# Patient Record
Sex: Male | Born: 1960
Health system: Southern US, Community
[De-identification: ages and names within clinical notes are randomized; demographics above are authoritative.]

## PROBLEM LIST (undated history)

## (undated) DIAGNOSIS — J45909 Unspecified asthma, uncomplicated: Secondary | ICD-10-CM

## (undated) DIAGNOSIS — E785 Hyperlipidemia, unspecified: Secondary | ICD-10-CM

## (undated) HISTORY — PX: OTHER SURGICAL HISTORY: SHX169

## (undated) HISTORY — DX: Unspecified asthma, uncomplicated: J45.909

## (undated) HISTORY — DX: Hyperlipidemia, unspecified: E78.5

---
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
## 2005-06-19 ENCOUNTER — Ambulatory Visit: Payer: Self-pay | Admitting: Internal Medicine

## 2005-06-24 ENCOUNTER — Ambulatory Visit: Payer: Self-pay | Admitting: Internal Medicine

## 2007-06-25 ENCOUNTER — Ambulatory Visit: Payer: Self-pay | Admitting: Internal Medicine

## 2007-06-25 LAB — CONVERTED CEMR LAB
ALT: 21 units/L (ref 0–53)
AST: 17 units/L (ref 0–37)
Albumin: 4.2 g/dL (ref 3.5–5.2)
Alkaline Phosphatase: 47 units/L (ref 39–117)
BUN: 13 mg/dL (ref 6–23)
Basophils Absolute: 0 10*3/uL (ref 0.0–0.1)
Basophils Relative: 0.3 % (ref 0.0–1.0)
Bilirubin Urine: NEGATIVE
Bilirubin, Direct: 0.1 mg/dL (ref 0.0–0.3)
Blood in Urine, dipstick: NEGATIVE
CO2: 30 meq/L (ref 19–32)
Calcium: 10.2 mg/dL (ref 8.4–10.5)
Chloride: 114 meq/L — ABNORMAL HIGH (ref 96–112)
Cholesterol: 258 mg/dL (ref 0–200)
Creatinine, Ser: 0.9 mg/dL (ref 0.4–1.5)
Direct LDL: 188.4 mg/dL
Eosinophils Absolute: 0.1 10*3/uL (ref 0.0–0.6)
Eosinophils Relative: 2.6 % (ref 0.0–5.0)
GFR calc Af Amer: 117 mL/min
GFR calc non Af Amer: 97 mL/min
Glucose, Bld: 96 mg/dL (ref 70–99)
Glucose, Urine, Semiquant: NEGATIVE
HCT: 41.6 % (ref 39.0–52.0)
HDL: 39.4 mg/dL (ref >39.0)
Hemoglobin: 14.3 g/dL (ref 13.0–17.0)
Ketones, urine, test strip: NEGATIVE
Lymphocytes Relative: 25 % (ref 12.0–46.0)
MCHC: 34.5 g/dL (ref 30.0–36.0)
MCV: 83.4 fL (ref 78.0–100.0)
Monocytes Absolute: 0.3 10*3/uL (ref 0.2–0.7)
Monocytes Relative: 5 % (ref 3.0–11.0)
Neutro Abs: 3.5 10*3/uL (ref 1.4–7.7)
Neutrophils Relative %: 67.1 % (ref 43.0–77.0)
Nitrite: NEGATIVE
Platelets: 246 10*3/uL (ref 150–400)
Potassium: 5.1 meq/L (ref 3.5–5.1)
Protein, U semiquant: NEGATIVE
RBC: 4.99 M/uL (ref 4.22–5.81)
RDW: 12.1 % (ref 11.5–14.6)
Sodium: 154 meq/L — ABNORMAL HIGH (ref 135–145)
Specific Gravity, Urine: 1.025
TSH: 0.65 microintl units/mL (ref 0.35–5.50)
Total Bilirubin: 1 mg/dL (ref 0.3–1.2)
Total CHOL/HDL Ratio: 6.5
Total Protein: 6.6 g/dL (ref 6.0–8.3)
Triglycerides: 155 mg/dL — ABNORMAL HIGH (ref 0–149)
Urobilinogen, UA: 0.2
VLDL: 31 mg/dL (ref 0–40)
WBC Urine, dipstick: NEGATIVE
WBC: 5.2 10*3/uL (ref 4.5–10.5)
pH: 5

## 2007-06-30 DIAGNOSIS — K219 Gastro-esophageal reflux disease without esophagitis: Secondary | ICD-10-CM

## 2007-07-28 ENCOUNTER — Ambulatory Visit: Payer: Self-pay | Admitting: Internal Medicine

## 2007-07-28 DIAGNOSIS — E782 Mixed hyperlipidemia: Secondary | ICD-10-CM | POA: Insufficient documentation

## 2007-09-20 ENCOUNTER — Ambulatory Visit: Payer: Self-pay | Admitting: Internal Medicine

## 2007-09-20 DIAGNOSIS — T887XXA Unspecified adverse effect of drug or medicament, initial encounter: Secondary | ICD-10-CM

## 2007-09-20 LAB — CONVERTED CEMR LAB
ALT: 18 units/L (ref 0–53)
AST: 18 units/L (ref 0–37)
Albumin: 4.4 g/dL (ref 3.5–5.2)
Alkaline Phosphatase: 43 units/L (ref 39–117)
BUN: 13 mg/dL (ref 6–23)
Bilirubin, Direct: 0.2 mg/dL (ref 0.0–0.3)
CO2: 30 meq/L (ref 19–32)
Calcium: 10.2 mg/dL (ref 8.4–10.5)
Chloride: 102 meq/L (ref 96–112)
Cholesterol: 214 mg/dL (ref 0–200)
Creatinine, Ser: 1 mg/dL (ref 0.4–1.5)
Direct LDL: 138.7 mg/dL
GFR calc Af Amer: 103 mL/min
GFR calc non Af Amer: 86 mL/min
Glucose, Bld: 109 mg/dL — ABNORMAL HIGH (ref 70–99)
HDL: 44.4 mg/dL (ref >39.0)
Potassium: 4.9 meq/L (ref 3.5–5.1)
Sodium: 140 meq/L (ref 135–145)
Total Bilirubin: 1.1 mg/dL (ref 0.3–1.2)
Total CHOL/HDL Ratio: 4.8
Total Protein: 7.1 g/dL (ref 6.0–8.3)
Triglycerides: 145 mg/dL (ref 0–149)
VLDL: 29 mg/dL (ref 0–40)

## 2007-09-28 ENCOUNTER — Ambulatory Visit: Payer: Self-pay | Admitting: Internal Medicine

## 2009-01-16 ENCOUNTER — Telehealth: Payer: Self-pay | Admitting: Internal Medicine

## 2009-02-15 ENCOUNTER — Ambulatory Visit: Payer: Self-pay | Admitting: Internal Medicine

## 2009-02-15 LAB — CONVERTED CEMR LAB
BUN: 11 mg/dL (ref 6–23)
Bilirubin, Direct: 0.2 mg/dL (ref 0.0–0.3)
CO2: 27 meq/L (ref 19–32)
Chloride: 104 meq/L (ref 96–112)
Cholesterol: 181 mg/dL (ref 0–200)
Creatinine, Ser: 1 mg/dL (ref 0.4–1.5)
Eosinophils Absolute: 0.1 10*3/uL (ref 0.0–0.7)
Glucose, Bld: 102 mg/dL — ABNORMAL HIGH (ref 70–99)
Glucose, Urine, Semiquant: NEGATIVE
HCT: 40.2 % (ref 39.0–52.0)
Lymphs Abs: 1.2 10*3/uL (ref 0.7–4.0)
MCHC: 34.6 g/dL (ref 30.0–36.0)
MCV: 83.1 fL (ref 78.0–100.0)
Monocytes Absolute: 0.3 10*3/uL (ref 0.1–1.0)
Neutrophils Relative %: 69.1 % (ref 43.0–77.0)
PSA: 0.9 ng/mL (ref 0.10–4.00)
Platelets: 210 10*3/uL (ref 150.0–400.0)
Specific Gravity, Urine: 1.025
TSH: 0.75 microintl units/mL (ref 0.35–5.50)
Total Bilirubin: 1.1 mg/dL (ref 0.3–1.2)
Total Protein: 7.3 g/dL (ref 6.0–8.3)
Triglycerides: 93 mg/dL (ref 0.0–149.0)
Urobilinogen, UA: 0.2
pH: 6

## 2009-02-22 ENCOUNTER — Ambulatory Visit: Payer: Self-pay | Admitting: Internal Medicine

## 2010-10-04 ENCOUNTER — Ambulatory Visit
Admission: RE | Admit: 2010-10-04 | Discharge: 2010-10-04 | Payer: Self-pay | Source: Home / Self Care | Attending: Internal Medicine | Admitting: Internal Medicine

## 2010-10-04 LAB — CONVERTED CEMR LAB
AST: 18 units/L (ref 0–37)
Albumin: 4 g/dL (ref 3.5–5.2)
BUN: 16 mg/dL (ref 6–23)
Basophils Absolute: 0 10*3/uL (ref 0.0–0.1)
Blood in Urine, dipstick: NEGATIVE
CO2: 27 meq/L (ref 19–32)
Chloride: 105 meq/L (ref 96–112)
Direct LDL: 157.4 mg/dL
Eosinophils Absolute: 0.2 10*3/uL (ref 0.0–0.7)
GFR calc non Af Amer: 92.56 mL/min (ref >60.00)
Glucose, Bld: 100 mg/dL — ABNORMAL HIGH (ref 70–99)
Glucose, Urine, Semiquant: NEGATIVE
HCT: 41.7 % (ref 39.0–52.0)
HDL: 39.9 mg/dL (ref >39.00)
Hemoglobin: 14.1 g/dL (ref 13.0–17.0)
Lymphs Abs: 1.5 10*3/uL (ref 0.7–4.0)
MCHC: 33.9 g/dL (ref 30.0–36.0)
MCV: 83.2 fL (ref 78.0–100.0)
Monocytes Absolute: 0.3 10*3/uL (ref 0.1–1.0)
Monocytes Relative: 5.2 % (ref 3.0–12.0)
Neutro Abs: 3.5 10*3/uL (ref 1.4–7.7)
Nitrite: NEGATIVE
Platelets: 236 10*3/uL (ref 150.0–400.0)
Potassium: 4.5 meq/L (ref 3.5–5.1)
RDW: 13.2 % (ref 11.5–14.6)
Sodium: 137 meq/L (ref 135–145)
Specific Gravity, Urine: 1.025
TSH: 0.72 microintl units/mL (ref 0.35–5.50)
Total Bilirubin: 0.8 mg/dL (ref 0.3–1.2)
WBC Urine, dipstick: NEGATIVE
pH: 5

## 2010-10-11 ENCOUNTER — Ambulatory Visit: Admit: 2010-10-11 | Payer: Self-pay | Admitting: Internal Medicine

## 2010-10-18 ENCOUNTER — Ambulatory Visit
Admission: RE | Admit: 2010-10-18 | Discharge: 2010-10-18 | Payer: Self-pay | Source: Home / Self Care | Attending: Internal Medicine | Admitting: Internal Medicine
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
## 2011-01-24 ENCOUNTER — Encounter: Payer: Self-pay | Admitting: Internal Medicine

## 2011-06-11 ENCOUNTER — Other Ambulatory Visit: Payer: Self-pay | Admitting: *Deleted

## 2011-06-11 MED ORDER — SIMVASTATIN 20 MG PO TABS: 20.0000 mg | ORAL_TABLET | Freq: Every evening | ORAL | Status: DC

## 2012-10-18 ENCOUNTER — Encounter: Payer: Self-pay | Admitting: Internal Medicine

## 2012-10-30 ENCOUNTER — Other Ambulatory Visit: Payer: Self-pay | Admitting: Internal Medicine

## 2012-11-06 ENCOUNTER — Other Ambulatory Visit: Payer: Self-pay | Admitting: Internal Medicine

## 2012-11-13 ENCOUNTER — Other Ambulatory Visit: Payer: Self-pay | Admitting: Internal Medicine

## 2012-11-19 ENCOUNTER — Encounter: Payer: Self-pay | Admitting: Internal Medicine

## 2012-12-02 ENCOUNTER — Ambulatory Visit (INDEPENDENT_AMBULATORY_CARE_PROVIDER_SITE_OTHER): Payer: Managed Care, Other (non HMO) | Admitting: Internal Medicine

## 2012-12-02 ENCOUNTER — Encounter: Payer: Self-pay | Admitting: Internal Medicine

## 2012-12-02 VITALS — BP 114/84 | Temp 97.4°F | Ht 71.5 in | Wt 232.0 lb

## 2012-12-02 DIAGNOSIS — Z0000 Encounter for general adult medical examination without abnormal findings: Secondary | ICD-10-CM

## 2012-12-02 DIAGNOSIS — E663 Overweight: Secondary | ICD-10-CM

## 2012-12-02 DIAGNOSIS — Z23 Encounter for immunization: Secondary | ICD-10-CM

## 2012-12-02 LAB — CBC WITH DIFFERENTIAL/PLATELET
Eosinophils Relative: 3.1 % (ref 0.0–5.0)
HCT: 41.5 % (ref 39.0–52.0)
Hemoglobin: 13.9 g/dL (ref 13.0–17.0)
Lymphs Abs: 1.2 10*3/uL (ref 0.7–4.0)
Monocytes Relative: 5.1 % (ref 3.0–12.0)
Neutro Abs: 4.2 10*3/uL (ref 1.4–7.7)
RBC: 5.07 Mil/uL (ref 4.22–5.81)
WBC: 5.8 10*3/uL (ref 4.5–10.5)

## 2012-12-02 LAB — HEPATIC FUNCTION PANEL
Albumin: 4 g/dL (ref 3.5–5.2)
Bilirubin, Direct: 0.1 mg/dL (ref 0.0–0.3)
Total Protein: 6.7 g/dL (ref 6.0–8.3)

## 2012-12-02 LAB — POCT URINALYSIS DIPSTICK
Leukocytes, UA: NEGATIVE
Spec Grav, UA: 1.02
Urobilinogen, UA: 0.2
pH, UA: 6

## 2012-12-02 LAB — LDL CHOLESTEROL, DIRECT: Direct LDL: 145.8 mg/dL

## 2012-12-02 LAB — BASIC METABOLIC PANEL
CO2: 27 mEq/L (ref 19–32)
Calcium: 9.2 mg/dL (ref 8.4–10.5)
Creatinine, Ser: 0.9 mg/dL (ref 0.4–1.5)
GFR: 91.77 mL/min (ref >60.00)

## 2012-12-02 LAB — LIPID PANEL
HDL: 43.8 mg/dL (ref >39.00)
Total CHOL/HDL Ratio: 5
VLDL: 44.6 mg/dL — ABNORMAL HIGH (ref 0.0–40.0)

## 2012-12-02 MED ORDER — SIMVASTATIN 20 MG PO TABS: 20.0000 mg | ORAL_TABLET | Freq: Every evening | ORAL | Status: DC
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
## 2012-12-06 ENCOUNTER — Other Ambulatory Visit: Payer: Self-pay | Admitting: *Deleted

## 2012-12-06 MED ORDER — CIPROFLOXACIN HCL 500 MG PO TABS: 500.0000 mg | ORAL_TABLET | Freq: Two times a day (BID) | ORAL | Status: DC

## 2012-12-22 ENCOUNTER — Other Ambulatory Visit: Payer: Self-pay

## 2012-12-29 ENCOUNTER — Encounter: Payer: Self-pay | Admitting: Internal Medicine

## 2013-02-04 ENCOUNTER — Telehealth: Payer: Self-pay | Admitting: Internal Medicine

## 2013-02-04 NOTE — Telephone Encounter (Signed)
Pt would like a return call asap concerning his PSA results.  Pt has finished his antibiotics and was instructed to follow up.

## 2013-02-04 NOTE — Telephone Encounter (Signed)
Pt needs a lab appt for PSA

## 2013-02-04 NOTE — Telephone Encounter (Signed)
appt made/kh 

## 2013-02-04 NOTE — Telephone Encounter (Signed)
lmom for pt to call and sch labs

## 2013-02-09 ENCOUNTER — Other Ambulatory Visit (INDEPENDENT_AMBULATORY_CARE_PROVIDER_SITE_OTHER): Payer: Managed Care, Other (non HMO)

## 2013-02-09 DIAGNOSIS — R972 Elevated prostate specific antigen [PSA]: Secondary | ICD-10-CM

## 2013-02-11 ENCOUNTER — Encounter: Payer: Self-pay | Admitting: Internal Medicine

## 2013-08-11 ENCOUNTER — Other Ambulatory Visit: Payer: Self-pay

## 2013-12-02 ENCOUNTER — Other Ambulatory Visit: Payer: Self-pay

## 2013-12-05 ENCOUNTER — Other Ambulatory Visit (INDEPENDENT_AMBULATORY_CARE_PROVIDER_SITE_OTHER): Payer: Managed Care, Other (non HMO)

## 2013-12-05 DIAGNOSIS — Z0000 Encounter for general adult medical examination without abnormal findings: Secondary | ICD-10-CM

## 2013-12-05 LAB — CBC WITH DIFFERENTIAL/PLATELET
Basophils Absolute: 0 10*3/uL (ref 0.0–0.1)
Basophils Relative: 0.4 % (ref 0.0–3.0)
EOS PCT: 1.6 % (ref 0.0–5.0)
Eosinophils Absolute: 0.1 10*3/uL (ref 0.0–0.7)
HEMATOCRIT: 44.3 % (ref 39.0–52.0)
Hemoglobin: 14.9 g/dL (ref 13.0–17.0)
Lymphocytes Relative: 24 % (ref 12.0–46.0)
Lymphs Abs: 0.8 10*3/uL (ref 0.7–4.0)
MCHC: 33.6 g/dL (ref 30.0–36.0)
MCV: 82.9 fl (ref 78.0–100.0)
MONOS PCT: 13.5 % — AB (ref 3.0–12.0)
Monocytes Absolute: 0.5 10*3/uL (ref 0.1–1.0)
NEUTROS PCT: 60.5 % (ref 43.0–77.0)
Neutro Abs: 2.1 10*3/uL (ref 1.4–7.7)
PLATELETS: 245 10*3/uL (ref 150.0–400.0)
RBC: 5.34 Mil/uL (ref 4.22–5.81)
RDW: 13.2 % (ref 11.5–14.6)
WBC: 3.4 10*3/uL — AB (ref 4.5–10.5)

## 2013-12-05 LAB — POCT URINALYSIS DIPSTICK
Bilirubin, UA: NEGATIVE
Blood, UA: NEGATIVE
Glucose, UA: NEGATIVE
Ketones, UA: NEGATIVE
LEUKOCYTES UA: NEGATIVE
Nitrite, UA: NEGATIVE
SPEC GRAV UA: 1.025
Urobilinogen, UA: 0.2
pH, UA: 5.5

## 2013-12-05 LAB — LIPID PANEL
CHOLESTEROL: 201 mg/dL — AB (ref 0–200)
HDL: 46.7 mg/dL (ref >39.00)
LDL Cholesterol: 122 mg/dL — ABNORMAL HIGH (ref 0–99)
Total CHOL/HDL Ratio: 4
Triglycerides: 161 mg/dL — ABNORMAL HIGH (ref 0.0–149.0)
VLDL: 32.2 mg/dL (ref 0.0–40.0)

## 2013-12-05 LAB — BASIC METABOLIC PANEL
BUN: 12 mg/dL (ref 6–23)
CO2: 26 mEq/L (ref 19–32)
CREATININE: 1.1 mg/dL (ref 0.4–1.5)
Calcium: 9.3 mg/dL (ref 8.4–10.5)
Chloride: 104 mEq/L (ref 96–112)
GFR: 77.63 mL/min (ref >60.00)
Glucose, Bld: 92 mg/dL (ref 70–99)
POTASSIUM: 4.6 meq/L (ref 3.5–5.1)
Sodium: 138 mEq/L (ref 135–145)

## 2013-12-05 LAB — HEPATIC FUNCTION PANEL
ALT: 19 U/L (ref 0–53)
AST: 17 U/L (ref 0–37)
Albumin: 4.3 g/dL (ref 3.5–5.2)
Alkaline Phosphatase: 56 U/L (ref 39–117)
BILIRUBIN DIRECT: 0.1 mg/dL (ref 0.0–0.3)
BILIRUBIN TOTAL: 0.7 mg/dL (ref 0.3–1.2)
Total Protein: 7.2 g/dL (ref 6.0–8.3)

## 2013-12-05 LAB — PSA: PSA: 1.75 ng/mL (ref 0.10–4.00)

## 2013-12-05 LAB — TSH: TSH: 0.78 u[IU]/mL (ref 0.35–5.50)

## 2013-12-09 ENCOUNTER — Ambulatory Visit (INDEPENDENT_AMBULATORY_CARE_PROVIDER_SITE_OTHER): Payer: Managed Care, Other (non HMO) | Admitting: Internal Medicine

## 2013-12-09 ENCOUNTER — Encounter: Payer: Self-pay | Admitting: Internal Medicine

## 2013-12-09 VITALS — BP 114/76 | HR 88 | Temp 98.0°F | Ht 71.25 in | Wt 230.0 lb

## 2013-12-09 DIAGNOSIS — Z0000 Encounter for general adult medical examination without abnormal findings: Secondary | ICD-10-CM

## 2013-12-09 NOTE — Progress Notes (Signed)
Pre visit review using our clinic review tool, if applicable. No additional management support is needed unless otherwise documented below in the visit note. 
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
## 2014-01-13 ENCOUNTER — Encounter: Payer: Self-pay | Admitting: Internal Medicine

## 2015-01-15 DIAGNOSIS — Z8249 Family history of ischemic heart disease and other diseases of the circulatory system: Secondary | ICD-10-CM | POA: Insufficient documentation

## 2015-01-15 DIAGNOSIS — J45990 Exercise induced bronchospasm: Secondary | ICD-10-CM | POA: Insufficient documentation

## 2016-08-05 LAB — HM HEPATITIS C SCREENING LAB: HM Hepatitis Screen: NEGATIVE

## 2017-12-09 ENCOUNTER — Encounter: Payer: Self-pay | Admitting: Emergency Medicine

## 2017-12-16 ENCOUNTER — Encounter: Payer: Self-pay | Admitting: Emergency Medicine

## 2017-12-25 ENCOUNTER — Encounter: Payer: Self-pay | Admitting: Family Medicine

## 2017-12-25 ENCOUNTER — Ambulatory Visit (INDEPENDENT_AMBULATORY_CARE_PROVIDER_SITE_OTHER): Payer: 59

## 2017-12-25 ENCOUNTER — Other Ambulatory Visit: Payer: Self-pay

## 2017-12-25 ENCOUNTER — Ambulatory Visit (INDEPENDENT_AMBULATORY_CARE_PROVIDER_SITE_OTHER): Payer: 59 | Admitting: Family Medicine

## 2017-12-25 VITALS — BP 110/82 | HR 73 | Temp 97.9°F | Ht 71.0 in | Wt 215.4 lb

## 2017-12-25 DIAGNOSIS — J301 Allergic rhinitis due to pollen: Secondary | ICD-10-CM

## 2017-12-25 DIAGNOSIS — E782 Mixed hyperlipidemia: Secondary | ICD-10-CM | POA: Diagnosis not present

## 2017-12-25 DIAGNOSIS — Z0000 Encounter for general adult medical examination without abnormal findings: Secondary | ICD-10-CM | POA: Diagnosis not present

## 2017-12-25 DIAGNOSIS — Z8249 Family history of ischemic heart disease and other diseases of the circulatory system: Secondary | ICD-10-CM

## 2017-12-25 DIAGNOSIS — M533 Sacrococcygeal disorders, not elsewhere classified: Secondary | ICD-10-CM | POA: Diagnosis not present

## 2017-12-25 LAB — CBC WITH DIFFERENTIAL/PLATELET
BASOS PCT: 0.5 % (ref 0.0–3.0)
Basophils Absolute: 0 10*3/uL (ref 0.0–0.1)
EOS PCT: 1.4 % (ref 0.0–5.0)
Eosinophils Absolute: 0.1 10*3/uL (ref 0.0–0.7)
HCT: 40.1 % (ref 39.0–52.0)
Hemoglobin: 13.7 g/dL (ref 13.0–17.0)
LYMPHS ABS: 1.2 10*3/uL (ref 0.7–4.0)
Lymphocytes Relative: 20.4 % (ref 12.0–46.0)
MCHC: 34.2 g/dL (ref 30.0–36.0)
MCV: 81.3 fl (ref 78.0–100.0)
Monocytes Absolute: 0.3 10*3/uL (ref 0.1–1.0)
Monocytes Relative: 4.7 % (ref 3.0–12.0)
NEUTROS ABS: 4.2 10*3/uL (ref 1.4–7.7)
NEUTROS PCT: 73 % (ref 43.0–77.0)
PLATELETS: 272 10*3/uL (ref 150.0–400.0)
RBC: 4.93 Mil/uL (ref 4.22–5.81)
RDW: 13.9 % (ref 11.5–15.5)
WBC: 5.7 10*3/uL (ref 4.0–10.5)

## 2017-12-25 LAB — LIPID PANEL
CHOL/HDL RATIO: 5
Cholesterol: 205 mg/dL — ABNORMAL HIGH (ref 0–200)
HDL: 42.9 mg/dL (ref >39.00)
NonHDL: 161.63
Triglycerides: 237 mg/dL — ABNORMAL HIGH (ref 0.0–149.0)
VLDL: 47.4 mg/dL — AB (ref 0.0–40.0)

## 2017-12-25 LAB — COMPREHENSIVE METABOLIC PANEL
ALK PHOS: 50 U/L (ref 39–117)
ALT: 13 U/L (ref 0–53)
AST: 14 U/L (ref 0–37)
Albumin: 4.5 g/dL (ref 3.5–5.2)
BUN: 26 mg/dL — ABNORMAL HIGH (ref 6–23)
CHLORIDE: 104 meq/L (ref 96–112)
CO2: 25 mEq/L (ref 19–32)
Calcium: 9.6 mg/dL (ref 8.4–10.5)
Creatinine, Ser: 0.99 mg/dL (ref 0.40–1.50)
GFR: 82.75 mL/min (ref >60.00)
GLUCOSE: 95 mg/dL (ref 70–99)
POTASSIUM: 4.7 meq/L (ref 3.5–5.1)
Sodium: 139 mEq/L (ref 135–145)
Total Bilirubin: 0.5 mg/dL (ref 0.2–1.2)
Total Protein: 6.9 g/dL (ref 6.0–8.3)

## 2017-12-25 LAB — POCT URINALYSIS DIPSTICK
Blood, UA: NEGATIVE
Glucose, UA: NEGATIVE
LEUKOCYTES UA: NEGATIVE
NITRITE UA: NEGATIVE
PH UA: 6 (ref 5.0–8.0)
Spec Grav, UA: 1.02 (ref 1.010–1.025)
UROBILINOGEN UA: 1 U/dL

## 2017-12-25 LAB — TSH: TSH: 0.65 u[IU]/mL (ref 0.35–4.50)

## 2017-12-25 LAB — LDL CHOLESTEROL, DIRECT: LDL DIRECT: 135 mg/dL

## 2017-12-25 MED ORDER — ALBUTEROL SULFATE 108 (90 BASE) MCG/ACT IN AEPB: 2.0000 | INHALATION_SPRAY | RESPIRATORY_TRACT | 3 refills | Status: AC | PRN
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
## 2017-12-25 NOTE — Addendum Note (Signed)
Addended by: Katina Dung on: 12/25/2017 11:54 AM   Modules accepted: Orders

## 2017-12-25 NOTE — Patient Instructions (Addendum)
It was so good seeing you again! Thank you for establishing with my new practice and allowing me to continue caring for you. It means a lot to me.   I will release your lab results to you on your MyChart account with further instructions. Please reply with any questions.   Please schedule a follow up appointment with me in 12 months for your annual complete physical; please come fasting.  Please go to our [facility] office to get your xrays done. You can walk in M-F between 8am and 5pm. Tell them you are there for xrays ordered by me. They will send me the results, then I will let you know the results with instructions.   Address: [place], [place], [place]  (office sits at [address] at [address] intersection; from here, turn left onto [address]), take to [address], turn right and go for a mile or so, office will be on left across form [employer] )  Please do these things to maintain good health!   Exercise at least 30-45 minutes a day,  4-5 days a week.   Eat a low-fat diet with lots of fruits and vegetables, up to 7-9 servings per day.  Drink plenty of water daily. Try to drink 8 8oz glasses per day.  Seatbelts can save your life. Always wear your seatbelt.  Place Smoke Detectors on every level of your home and check batteries every year.  Eye Doctor - have an eye exam every 1-2 years  Safe sex - use condoms to protect yourself from STDs if you could be exposed to these types of infections.  Avoid heavy alcohol use. If you drink, keep it to less than 2 drinks/day and not every day.  [place].  Choose someone you trust that could speak for you if you became unable to speak for yourself.  Depression is common in our stressful world.If you're feeling down or losing interest in things you normally enjoy, please come in for a visit.  Tailbone Injury The tailbone (coccyx) is the  small bone at the lower end of the spine. A tailbone injury may involve stretched ligaments, bruising, or a broken bone (fracture). Tailbone injuries can be painful, and some may take a long time to heal. What are the causes? This condition is often caused by falling and landing on the tailbone. Other causes include:  Repeated strain or friction from actions such as rowing and bicycling.  Childbirth.  In some cases, the cause may not be known. What increases the risk? This condition is more common in women than in men. What are the signs or symptoms? Symptoms of this condition include:  Pain in the lower back, especially when sitting.  Pain or difficulty when standing up from a sitting position.  Bruising in the tailbone area.  Painful bowel movements.  In women, pain during intercourse.  How is this diagnosed? This condition may be diagnosed based on your symptoms and a physical exam. X-rays may be taken if a fracture is suspected. You may also have other tests, such as a CT scan or MRI. How is this treated? This condition may be treated with medicines to help relieve your pain. Most tailbone injuries heal on their own in 4-6 weeks. However, recovery time may be longer if the injury involves a fracture. Follow these instructions at home:  Take medicines only as directed by your health care provider.  If directed, apply ice to the injured area: ? Put ice in a plastic bag. ? Place a towel between your skin and the bag. ? Leave the ice on for 20 minutes, 2-3 times per day for the first 1-2 days.  Sit on a large, rubber or inflated ring or cushion to ease your pain. Lean forward when you are sitting to help decrease discomfort.  Avoid sitting for long periods of time.  Increase your activity as the pain allows. Perform any exercises that are recommended by your health care provider or physical therapist.  If you have pain during bowel movements, use stool softeners as directed  by your health care provider.  Eat a diet that includes plenty of fiber to help prevent constipation.  Keep all follow-up visits as directed by your health care provider. This is important. How is this prevented? Wear appropriate padding and sports gear when bicycling and rowing. This can help to prevent developing an injury that is caused by repeated strain or friction. Contact a health care provider if:  Your pain becomes worse.  Your bowel movements cause a great deal of discomfort.  You are unable to have a bowel movement.  You have uncontrolled urine loss (urinary incontinence).  You have a fever. This information is not intended to replace advice given to you by your health care provider. Make sure you discuss any questions you have with your health care provider. Document Released: 09/19/2000 Document Revised: 05/22/2016 Document Reviewed: 09/18/2014 Elsevier Interactive Patient Education  [employer].

## 2017-12-26 LAB — HIV ANTIBODY (ROUTINE TESTING W REFLEX): HIV 1&2 Ab, 4th Generation: NONREACTIVE

## 2017-12-28 ENCOUNTER — Encounter: Payer: Self-pay | Admitting: Family Medicine

## 2017-12-29 ENCOUNTER — Encounter: Payer: Self-pay | Admitting: Family Medicine

## 2018-11-28 ENCOUNTER — Encounter: Payer: Self-pay | Admitting: Family Medicine

## 2018-11-29 MED ORDER — SIMVASTATIN 20 MG PO TABS: 20.0000 mg | ORAL_TABLET | Freq: Every evening | ORAL | 0 refills | Status: DC

## 2019-02-20 ENCOUNTER — Other Ambulatory Visit: Payer: Self-pay | Admitting: Family Medicine

## 2019-03-20 ENCOUNTER — Other Ambulatory Visit: Payer: Self-pay | Admitting: Family Medicine

## 2019-11-22 ENCOUNTER — Other Ambulatory Visit: Payer: Self-pay | Admitting: Family Medicine

## 2019-11-22 MED ORDER — SIMVASTATIN 20 MG PO TABS: 20.0000 mg | ORAL_TABLET | Freq: Every evening | ORAL | 0 refills | Status: DC

## 2019-11-22 NOTE — Telephone Encounter (Signed)
Last visit was 12/2017; overdue for HM visit and labs.  Informed pt would refill for 90 days but nothing further w/o cpe and labs.

## 2020-02-14 ENCOUNTER — Other Ambulatory Visit: Payer: Self-pay | Admitting: Family Medicine

## 2020-03-29 ENCOUNTER — Encounter: Payer: 59 | Admitting: Family Medicine

## 2020-04-12 ENCOUNTER — Encounter: Payer: Self-pay | Admitting: Family Medicine

## 2020-04-12 ENCOUNTER — Other Ambulatory Visit: Payer: Self-pay

## 2020-04-12 ENCOUNTER — Ambulatory Visit (INDEPENDENT_AMBULATORY_CARE_PROVIDER_SITE_OTHER): Payer: 59 | Admitting: Family Medicine

## 2020-04-12 VITALS — BP 128/74 | HR 67 | Temp 98.2°F | Resp 18 | Ht 71.0 in | Wt 230.8 lb

## 2020-04-12 DIAGNOSIS — Z8249 Family history of ischemic heart disease and other diseases of the circulatory system: Secondary | ICD-10-CM

## 2020-04-12 DIAGNOSIS — E782 Mixed hyperlipidemia: Secondary | ICD-10-CM | POA: Diagnosis not present

## 2020-04-12 DIAGNOSIS — J301 Allergic rhinitis due to pollen: Secondary | ICD-10-CM

## 2020-04-12 DIAGNOSIS — J45990 Exercise induced bronchospasm: Secondary | ICD-10-CM | POA: Diagnosis not present

## 2020-04-12 DIAGNOSIS — E559 Vitamin D deficiency, unspecified: Secondary | ICD-10-CM | POA: Diagnosis not present

## 2020-04-12 DIAGNOSIS — K219 Gastro-esophageal reflux disease without esophagitis: Secondary | ICD-10-CM

## 2020-04-12 DIAGNOSIS — Z0000 Encounter for general adult medical examination without abnormal findings: Secondary | ICD-10-CM

## 2020-04-12 DIAGNOSIS — E669 Obesity, unspecified: Secondary | ICD-10-CM

## 2020-04-12 DIAGNOSIS — F419 Anxiety disorder, unspecified: Secondary | ICD-10-CM

## 2020-04-12 LAB — COMPREHENSIVE METABOLIC PANEL
ALT: 16 U/L (ref 0–53)
AST: 17 U/L (ref 0–37)
Albumin: 4.7 g/dL (ref 3.5–5.2)
Alkaline Phosphatase: 49 U/L (ref 39–117)
BUN: 15 mg/dL (ref 6–23)
CO2: 26 mEq/L (ref 19–32)
Calcium: 9.7 mg/dL (ref 8.4–10.5)
Chloride: 103 mEq/L (ref 96–112)
Creatinine, Ser: 1 mg/dL (ref 0.40–1.50)
GFR: 76.35 mL/min (ref >60.00)
Glucose, Bld: 107 mg/dL — ABNORMAL HIGH (ref 70–99)
Potassium: 4.3 mEq/L (ref 3.5–5.1)
Sodium: 136 mEq/L (ref 135–145)
Total Bilirubin: 0.7 mg/dL (ref 0.2–1.2)
Total Protein: 7.2 g/dL (ref 6.0–8.3)

## 2020-04-12 LAB — LIPID PANEL
Cholesterol: 205 mg/dL — ABNORMAL HIGH (ref 0–200)
HDL: 58.1 mg/dL (ref >39.00)
LDL Cholesterol: 113 mg/dL — ABNORMAL HIGH (ref 0–99)
NonHDL: 146.57
Total CHOL/HDL Ratio: 4
Triglycerides: 166 mg/dL — ABNORMAL HIGH (ref 0.0–149.0)
VLDL: 33.2 mg/dL (ref 0.0–40.0)

## 2020-04-12 LAB — VITAMIN D 25 HYDROXY (VIT D DEFICIENCY, FRACTURES): VITD: 24.59 ng/mL — ABNORMAL LOW (ref 30.00–100.00)

## 2020-04-12 LAB — CBC WITH DIFFERENTIAL/PLATELET
Basophils Absolute: 0 10*3/uL (ref 0.0–0.1)
Basophils Relative: 0.4 % (ref 0.0–3.0)
Eosinophils Absolute: 0.2 10*3/uL (ref 0.0–0.7)
Eosinophils Relative: 3.6 % (ref 0.0–5.0)
HCT: 43.4 % (ref 39.0–52.0)
Hemoglobin: 14.6 g/dL (ref 13.0–17.0)
Lymphocytes Relative: 24.6 % (ref 12.0–46.0)
Lymphs Abs: 1.3 10*3/uL (ref 0.7–4.0)
MCHC: 33.7 g/dL (ref 30.0–36.0)
MCV: 83.5 fl (ref 78.0–100.0)
Monocytes Absolute: 0.3 10*3/uL (ref 0.1–1.0)
Monocytes Relative: 6.2 % (ref 3.0–12.0)
Neutro Abs: 3.5 10*3/uL (ref 1.4–7.7)
Neutrophils Relative %: 65.2 % (ref 43.0–77.0)
Platelets: 264 10*3/uL (ref 150.0–400.0)
RBC: 5.2 Mil/uL (ref 4.22–5.81)
RDW: 13.8 % (ref 11.5–15.5)
WBC: 5.4 10*3/uL (ref 4.0–10.5)

## 2020-04-12 LAB — TSH: TSH: 1.34 u[IU]/mL (ref 0.35–4.50)
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
## 2020-04-12 NOTE — Patient Instructions (Addendum)
Please return in 12 months for your annual complete physical; please come fasting.  I will release your lab results to you on your MyChart account with further instructions. Please reply with any questions.   If you have any questions or concerns, please don't hesitate to send me a message via MyChart or call the office at [phone]. Thank you for visiting with Joseph Gaines today! It's our pleasure caring for you.   Post-Traumatic Stress Disorder, Adult Post-traumatic stress disorder (PTSD) is a mental health disorder that can occur after a traumatic event, such as a threat to life, serious injury, or sexual violence. Sometimes, PTSD can occur in people who hear about trauma that occurs to a close family member or friend. PTSD can happen to anyone at any age. What are the causes? The condition may be caused by experiencing a traumatic event. What increases the risk? This condition is more likely to occur in:  Engineer, manufacturing.  People who are in circumstances where their lives are threatened.  People who have been the victim of, or witness to, a traumatic event, such as: ? Domestic violence. ? Physical or sexual abuse. ? Rape. ? A terrorist act or gun violence. ? Natural disasters. ? Accidents involving serious injury. What are the signs or symptoms? PTSD symptoms may start soon after a frightening event or months or years later. Symptoms last at least one month and tend to disrupt relationships, work, and daily activities. Symptoms of PTSD can be grouped into several categories. Intrusive symptoms This is when you re-experience the physical and emotional sensations of the traumatic event through one or more of the following ways:  Having upsetting dreams.  Feeling fear, horror, intense sadness, or anger in response to a reminder of the trauma.  Having unwanted upsetting memories while awake.  Having physical reactions triggered by reminders of the trauma, such as  increased heart rate, shortness of breath, sweating, and shaking.  Having flashbacks, or feeling like you are going through the event again. Avoidance symptoms This is when you avoid anything that reminds you of the trauma. Symptoms may also include:  Losing interest or not participating in daily activities.  Feeling disconnected from or avoiding other people.  Isolating yourself. Increased arousal symptoms You may have physical or emotional reactions triggered by your environment. Symptoms may include:  Being easily startled.  Behaving in a careless or self-destructive way.  Becoming easily irritated.  Feeling worried and nervous.  Having trouble concentrating.  Yelling at or hitting other people or objects.  Having trouble sleeping. Negative mood and thoughts  Believing that you or others are bad.  Feeling fear, horror, anger, sadness, guilt, or shame regularly.  Not being able to remember certain parts of the traumatic event.  Blaming yourself or others for the trauma.  Being unable to experience positive emotions, such as happiness or love. How is this diagnosed? PTSD is diagnosed through an assessment by a mental health professional. Dennis Bast will be asked questions about your symptoms. How is this treated? Treatment for this condition may include any of the following or a combination:  Taking medicines to reduce PTSD symptoms.  Having counseling with a mental health professional or therapist who is experienced in treating PTSD.  Doing eye movement desensitization and reprocessing therapy (EMDR). This type of therapy occurs with a specialized therapist. If you have other mental health concerns, these conditions will also be treated. Follow these instructions at home: Lifestyle  Find a support group in your community.  Groups are often available for [employer] veterans, trauma victims, and family members or caregivers.  Try to get 7-9 hours of sleep each night. To  help with sleep: ? Keep your bedroom cool and dark. ? Do not eat a heavy meal within 1 hour of bedtime. ? Do not drink alcohol or caffeinated drinks before bed. ? Avoid screen time, such as television, computers, tablets, or mobile phones, before bed.  Do not use illegal drugs.  Contact a local organization to find out if you are eligible for a service dog. Activity  Exercise regularly. Try to do at least 30 minutes of physical activity most days of the week.  Practice self-calming through: ? Breathing exercises. ? Meditation. ? Yoga. ? Listening to quiet music.  Do not isolate yourself. Make connections with other people.  Consider volunteering. Volunteering can help you feel more connected. Eating and drinking  Do not drink alcohol if: ? Your health care provider tells you not to drink. ? You are pregnant, may be pregnant, or are planning to become pregnant.  If you drink alcohol: ? Limit how much you use to:  0-1 drink a day for women.  0-2 drinks a day for men. ? Be aware of how much alcohol is in your drink. In the [place] one drink equals one 12 oz bottle of beer (355 mL), one 5 oz glass of wine (148 mL), or one 1 oz glass of hard liquor (44 mL). General instructions  Take steps to help yourself feel safer at home, such as by installing a security system.  Work with a health care provider or therapist to help manage your symptoms.  Take over-the-counter and prescription medicines as told by your health care provider.  Let others know that you have PTSD and the things that may trigger symptoms. This can protect you and help them understand you better.  If your PTSD is affecting your marriage or family, seek help from a family therapist.  Make sure to let all of your health care providers know you have PTSD. This is especially important if you are having surgery or need to be admitted to the hospital.  Keep all follow-up visits as told by your health care provider.  This is important. Contact a health care provider if:  Your symptoms do not get better.  You are feeling overwhelmed by your symptoms. Get help right away if:  You have thoughts of hurting yourself or others. If you ever feel like you may hurt yourself or others, or have thoughts about taking your own life, get help right away. You can go to your nearest emergency department or call:  Your local emergency services (911 in the [place]  A suicide crisis helpline, such as the [facility] at 204-745-0321. This is open 24 hours a day. Summary  Post-traumatic stress disorder (PTSD) is a mental health disorder that can occur after a traumatic event.  Treatment for PTSD may include medicines, counseling, eye movement desensitization and reprocessing therapy (EMDR), or a combination of therapies.  Find a support group in your community.  Get help right away if you have thoughts of hurting yourself or others. This information is not intended to replace advice given to you by your health care provider. Make sure you discuss any questions you have with your health care provider. Document Revised: 12/02/2018 Document Reviewed: 12/02/2018 Elsevier Patient Education  [place].

## 2020-05-01 ENCOUNTER — Telehealth: Payer: Self-pay

## 2020-05-01 NOTE — Telephone Encounter (Signed)
Patient requesting to have PSA checked. Please advise

## 2020-05-04 ENCOUNTER — Other Ambulatory Visit: Payer: Self-pay

## 2020-05-04 DIAGNOSIS — Z125 Encounter for screening for malignant neoplasm of prostate: Secondary | ICD-10-CM

## 2020-05-04 NOTE — Telephone Encounter (Signed)
Can schedule lab visit. Prostate ca screen. thanks

## 2020-05-04 NOTE — Telephone Encounter (Signed)
My Chart message sent

## 2020-08-03 ENCOUNTER — Other Ambulatory Visit: Payer: Self-pay | Admitting: Family Medicine

## 2020-08-03 MED ORDER — SIMVASTATIN 20 MG PO TABS: 20.0000 mg | ORAL_TABLET | Freq: Every evening | ORAL | 3 refills | Status: DC

## 2020-09-19 ENCOUNTER — Other Ambulatory Visit: Payer: Self-pay

## 2020-09-19 ENCOUNTER — Encounter: Payer: Self-pay | Admitting: Family Medicine

## 2020-09-19 ENCOUNTER — Ambulatory Visit (INDEPENDENT_AMBULATORY_CARE_PROVIDER_SITE_OTHER): Payer: 59 | Admitting: Family Medicine

## 2020-09-19 VITALS — BP 110/74 | HR 97 | Temp 97.2°F | Wt 237.2 lb

## 2020-09-19 DIAGNOSIS — D225 Melanocytic nevi of trunk: Secondary | ICD-10-CM | POA: Diagnosis not present

## 2020-09-19 DIAGNOSIS — Z23 Encounter for immunization: Secondary | ICD-10-CM | POA: Diagnosis not present

## 2020-09-19 DIAGNOSIS — D229 Melanocytic nevi, unspecified: Secondary | ICD-10-CM

## 2020-09-19 DIAGNOSIS — L720 Epidermal cyst: Secondary | ICD-10-CM | POA: Diagnosis not present

## 2020-09-19 MED ORDER — LIDOCAINE-EPINEPHRINE 1 %-1:100000 IJ SOLN
2.0000 mL | Freq: Once | INTRAMUSCULAR | Status: AC
  Administered 2020-09-19: 11:00:00 2 mL via INTRADERMAL

## 2020-09-19 NOTE — Patient Instructions (Addendum)
Please follow up if symptoms do not improve or as needed.   Keep the bandage on until tomorrow, then wash, dry and keep covered during the day for 5-7 days.   Skin Biopsy, Care After This sheet gives you information about how to care for yourself after your procedure. Your health care provider may also give you more specific instructions. If you have problems or questions, contact your health care provider. What can I expect after the procedure? After the procedure, it is common to have:  Soreness.  Bruising.  Itching. Follow these instructions at home: Biopsy site care Follow instructions from your health care provider about how to take care of your biopsy site. Make sure you:  Wash your hands with soap and water before and after you change your bandage (dressing). If soap and water are not available, use hand sanitizer.  Apply ointment on your biopsy site as directed by your health care provider.  Change your dressing as told by your health care provider.  Leave stitches (sutures), skin glue, or adhesive strips in place. These skin closures may need to stay in place for 2 weeks or longer. If adhesive strip edges start to loosen and curl up, you may trim the loose edges. Do not remove adhesive strips completely unless your health care provider tells you to do that.  If the biopsy area bleeds, apply gentle pressure for 10 minutes. Check your biopsy site every day for signs of infection. Check for:  Redness, swelling, or pain.  Fluid or blood.  Warmth.  Pus or a bad smell.  General instructions  Rest and then return to your normal activities as told by your health care provider.  Take over-the-counter and prescription medicines only as told by your health care provider.  Keep all follow-up visits as told by your health care provider. This is important. Contact a health care provider if:  You have redness, swelling, or pain around your biopsy site.  You have fluid or  blood coming from your biopsy site.  Your biopsy site feels warm to the touch.  You have pus or a bad smell coming from your biopsy site.  You have a fever.  Your sutures, skin glue, or adhesive strips loosen or come off sooner than expected. Get help right away if:  You have bleeding that does not stop with pressure or a dressing. Summary  After the procedure, it is common to have soreness, bruising, and itching at the site.  Follow instructions from your health care provider about how to take care of your biopsy site.  Check your biopsy site every day for signs of infection.  Contact a health care provider if you have redness, swelling, or pain around your biopsy site, or your biopsy site feels warm to the touch.  Keep all follow-up visits as told by your health care provider. This is important. This information is not intended to replace advice given to you by your health care provider. Make sure you discuss any questions you have with your health care provider. Document Revised: 03/22/2018 Document Reviewed: 03/22/2018 Elsevier Patient Education  [place].   Epidermal Cyst  An epidermal cyst is a sac made of skin tissue. The sac contains a substance called keratin. Keratin is a protein that is normally secreted through the hair follicles. When keratin becomes trapped in the top layer of skin (epidermis), it can form an epidermal cyst. Epidermal cysts can be found anywhere on your body. These cysts are usually harmless (  benign), and they may not cause symptoms unless they become infected. What are the causes? This condition may be caused by:  A blocked hair follicle.  A hair that curls and re-enters the skin instead of growing straight out of the skin (ingrown hair).  A blocked pore.  Irritated skin.  An injury to the skin.  Certain conditions that are passed along from parent to child (inherited).  Human papillomavirus (HPV).  Long-term (chronic) sun  damage to the skin. What increases the risk? The following factors may make you more likely to develop an epidermal cyst:  Having acne.  Being overweight.  Being 59 years old. What are the signs or symptoms? The only symptom of this condition may be a small, painless lump underneath the skin. When an epidermal cyst ruptures, it may become infected. Symptoms may include:  Redness.  Inflammation.  Tenderness.  Warmth.  Fever.  Keratin draining from the cyst. Keratin is grayish-white, bad-smelling substance.  Pus draining from the cyst. How is this diagnosed? This condition is diagnosed with a physical exam.  In some cases, you may have a sample of tissue (biopsy) taken from your cyst to be examined under a microscope or tested for bacteria.  You may be referred to a health care provider who specializes in skin care (dermatologist). How is this treated? In many cases, epidermal cysts go away on their own without treatment. If a cyst becomes infected, treatment may include:  Opening and draining the cyst, done by a health care provider. After draining, minor surgery to remove the rest of the cyst may be done.  Antibiotic medicine.  Injections of medicines (steroids) that help to reduce inflammation.  Surgery to remove the cyst. Surgery may be done if the cyst: ? Becomes large. ? Bothers you. ? Has a chance of turning into cancer.  Do not try to open a cyst yourself. Follow these instructions at home:  Take over-the-counter and prescription medicines only as told by your health care provider.  If you were prescribed an antibiotic medicine, take it it as told by your health care provider. Do not stop using the antibiotic even if you start to feel better.  Keep the area around your cyst clean and dry.  Wear loose, dry clothing.  Avoid touching your cyst.  Check your cyst every day for signs of infection. Check for: ? Redness, swelling, or pain. ? Fluid or  blood. ? Warmth. ? Pus or a bad smell.  Keep all follow-up visits as told by your health care provider. This is important. How is this prevented?  Wear clean, dry, clothing.  Avoid wearing tight clothing.  Keep your skin clean and dry. Take showers or baths every day. Contact a health care provider if:  Your cyst develops symptoms of infection.  Your condition is not improving or is getting worse.  You develop a cyst that looks different from other cysts you have had.  You have a fever. Get help right away if:  Redness spreads from the cyst into the surrounding area. Summary  An epidermal cyst is a sac made of skin tissue. These cysts are usually harmless (benign), and they may not cause symptoms unless they become infected.  If a cyst becomes infected, treatment may include surgery to open and drain the cyst, or to remove it. Treatment may also include medicines by mouth or through an injection.  Take over-the-counter and prescription medicines only as told by your health care provider. If you were prescribed  an antibiotic medicine, take it as told by your health care provider. Do not stop using the antibiotic even if you start to feel better.  Contact a health care provider if your condition is not improving or is getting worse.  Keep all follow-up visits as told by your health care provider. This is important. This information is not intended to replace advice given to you by your health care provider. Make sure you discuss any questions you have with your health care provider. Document Revised: 01/13/2019 Document Reviewed: 04/05/2018 Elsevier Patient Education  [place].

## 2020-09-19 NOTE — Progress Notes (Signed)
Subjective  CC:  Chief Complaint  Patient presents with  . Cyst    Left upper back, growing over the last 6 months  . Health Maintenance    Flu shot given in office today    HPI: Joseph Gaines is a 59 y.o. male who presents to the office today to address the problems listed above in the chief complaint.  Wife noticed a small pimple-like bump on his right upper back.  It is not bothersome to him.  No drainage.  No pain.  Has red mark mole center of his upper back that is growing.  At times it would be bleeding irritated.  He is concerned it could be precancerous.  No flaking.  No drainage.   Assessment  1. Sebaceous cyst   2. Atypical mole      Plan   Sebaceous cyst: Small, reassured.  Noninfected.  Monitor  Atypical mole, given size, location history of bleeding and irritation we did a shave biopsy today.  Await results.  Routine wound care discussed  Flu vaccine updated today.  Follow up: Return if symptoms worsen or fail to improve.  Visit date not found  No orders of the defined types were placed in this encounter.  No orders of the defined types were placed in this encounter.     I reviewed the patients updated PMH, FH, and SocHx.    Patient Active Problem List   Diagnosis Date Noted  . Obesity (BMI 30-39.9) 04/12/2020  . Seasonal allergic rhinitis due to pollen 12/25/2017  . Exercise-induced asthma 01/15/2015  . Family history of premature CAD 01/15/2015  . Mixed hyperlipidemia 07/28/2007  . GERD (gastroesophageal reflux disease) 06/30/2007   Current Meds  Medication Sig  . Albuterol Sulfate 108 (90 Base) MCG/ACT AEPB Inhale 2 puffs into the lungs every 4 (four) hours as needed. (Patient taking differently: Inhale 2 puffs into the lungs as needed.)  . fluticasone (FLONASE) 50 MCG/ACT nasal spray Place into the nose.  . simvastatin (ZOCOR) 20 MG tablet Take 1 tablet (20 mg total) by mouth at bedtime.    Allergies: Patient has No Known  Allergies. Family History: Patient family history includes Aortic aneurysm in his mother; Asthma in his father; COPD in his mother; Cancer in his father; Early death (age of onset: 61) in his father; Hearing loss in his father; Heart disease (age of onset: 9) in his father; Heart disease (age of onset: 40) in his mother; Hyperlipidemia in his brother; Hypertension in his father. Social History:  Patient  reports that he quit smoking about 31 years ago. He has quit using smokeless tobacco. He reports current alcohol use of about 2.0 standard drinks of alcohol per week. He reports that he does not use drugs.  Review of Systems: Constitutional: Negative for fever malaise or anorexia Cardiovascular: negative for chest pain Respiratory: negative for SOB or persistent cough Gastrointestinal: negative for abdominal pain  Objective  Vitals: BP 110/74   Pulse 97   Temp (!) 97.2 F (36.2 C) (Temporal)   Wt 237 lb 3.2 oz (107.6 kg)   SpO2 98%   BMI 33.08 kg/m  General: no acute distress , A&Ox3 Skin: 3 mm epidermal cyst without erythema fluctuance or tenderness right upper back, 7 mm erythematous verrucous-like mole central upper back  Shave Biopsy Procedure Note  Pre-operative Diagnosis: Suspicious lesion  Post-operative Diagnosis: same  Locations:upper central back  Indications: painful, bleeds growing  Anesthesia: Lidocaine 1% with epinephrine   Procedure Details  Patient informed of the risks (including bleeding and infection) and benefits of the  procedure and Verbal informed consent obtained.  The lesion and surrounding area were given a sterile prep using alcohol.  1 mL of lidocaine with epinephrine was used to anesthetize the area in routine fashion.  A derma blade was used to shave an area of skin approximately 26mm by 71mm.  Hemostasis achieved with silver nitrate. Antibiotic ointment and a sterile dressing applied.  The specimen was sent for pathologic examination. The  patient tolerated the procedure well.  EBL: 0 ml  Condition: Stable  Complications: none.     Commons side effects, risks, benefits, and alternatives for medications and treatment plan prescribed today were discussed, and the patient expressed understanding of the given instructions. Patient is instructed to call or message via MyChart if he/she has any questions or concerns regarding our treatment plan. No barriers to understanding were identified. We discussed Red Flag symptoms and signs in detail. Patient expressed understanding regarding what to do in case of urgent or emergency type symptoms.   Medication list was reconciled, printed and provided to the patient in AVS. Patient instructions and summary information was reviewed with the patient as documented in the AVS. This note was prepared with assistance of Dragon voice recognition software. Occasional wrong-word or sound-a-like substitutions may have occurred due to the inherent limitations of voice recognition software  This visit occurred during the SARS-CoV-2 public health emergency.  Safety protocols were in place, including screening questions prior to the visit, additional usage of staff PPE, and extensive cleaning of exam  while observing appropriate contact time as indicated for disinfecting solutions.

## 2020-09-19 NOTE — Addendum Note (Signed)
Addended by: Doran Clay A on: 09/19/2020 11:20 AM   Modules accepted: Orders

## 2020-11-21 ENCOUNTER — Telehealth: Payer: Self-pay

## 2020-11-21 NOTE — Telephone Encounter (Signed)
error 

## 2020-11-21 NOTE — Telephone Encounter (Signed)
Pt called back to speak with Katie. Please advise.

## 2020-11-21 NOTE — Telephone Encounter (Signed)
Pt is requesting a call from [place]. He states he has news for her.

## 2021-03-22 ENCOUNTER — Ambulatory Visit (INDEPENDENT_AMBULATORY_CARE_PROVIDER_SITE_OTHER): Payer: 59

## 2021-03-22 ENCOUNTER — Encounter: Payer: Self-pay | Admitting: Family Medicine

## 2021-03-22 ENCOUNTER — Ambulatory Visit (INDEPENDENT_AMBULATORY_CARE_PROVIDER_SITE_OTHER): Payer: 59 | Admitting: Family Medicine

## 2021-03-22 ENCOUNTER — Other Ambulatory Visit: Payer: Self-pay

## 2021-03-22 VITALS — BP 136/92 | HR 65 | Temp 98.2°F | Ht 71.0 in | Wt 239.4 lb

## 2021-03-22 DIAGNOSIS — R0609 Other forms of dyspnea: Secondary | ICD-10-CM

## 2021-03-22 DIAGNOSIS — Z8249 Family history of ischemic heart disease and other diseases of the circulatory system: Secondary | ICD-10-CM

## 2021-03-22 DIAGNOSIS — R0600 Dyspnea, unspecified: Secondary | ICD-10-CM

## 2021-03-22 DIAGNOSIS — E669 Obesity, unspecified: Secondary | ICD-10-CM

## 2021-03-22 DIAGNOSIS — R079 Chest pain, unspecified: Secondary | ICD-10-CM

## 2021-03-22 DIAGNOSIS — E782 Mixed hyperlipidemia: Secondary | ICD-10-CM | POA: Diagnosis not present

## 2021-03-22 DIAGNOSIS — K219 Gastro-esophageal reflux disease without esophagitis: Secondary | ICD-10-CM | POA: Diagnosis not present

## 2021-03-22 DIAGNOSIS — R0683 Snoring: Secondary | ICD-10-CM

## 2021-03-22 LAB — COMPREHENSIVE METABOLIC PANEL
ALT: 15 U/L (ref 0–53)
AST: 15 U/L (ref 0–37)
Albumin: 4.6 g/dL (ref 3.5–5.2)
Alkaline Phosphatase: 49 U/L (ref 39–117)
BUN: 14 mg/dL (ref 6–23)
CO2: 25 mEq/L (ref 19–32)
Calcium: 9.7 mg/dL (ref 8.4–10.5)
Chloride: 102 mEq/L (ref 96–112)
Creatinine, Ser: 0.96 mg/dL (ref 0.40–1.50)
GFR: 85.95 mL/min (ref >60.00)
Glucose, Bld: 108 mg/dL — ABNORMAL HIGH (ref 70–99)
Potassium: 4.1 mEq/L (ref 3.5–5.1)
Sodium: 137 mEq/L (ref 135–145)
Total Bilirubin: 0.9 mg/dL (ref 0.2–1.2)
Total Protein: 7.1 g/dL (ref 6.0–8.3)

## 2021-03-22 LAB — CBC WITH DIFFERENTIAL/PLATELET
Basophils Absolute: 0 10*3/uL (ref 0.0–0.1)
Basophils Relative: 0.6 % (ref 0.0–3.0)
Eosinophils Absolute: 0.2 10*3/uL (ref 0.0–0.7)
Eosinophils Relative: 3.3 % (ref 0.0–5.0)
HCT: 42.1 % (ref 39.0–52.0)
Hemoglobin: 14.5 g/dL (ref 13.0–17.0)
Lymphocytes Relative: 23.2 % (ref 12.0–46.0)
Lymphs Abs: 1.2 10*3/uL (ref 0.7–4.0)
MCHC: 34.5 g/dL (ref 30.0–36.0)
MCV: 80.9 fl (ref 78.0–100.0)
Monocytes Absolute: 0.3 10*3/uL (ref 0.1–1.0)
Monocytes Relative: 5.6 % (ref 3.0–12.0)
Neutro Abs: 3.4 10*3/uL (ref 1.4–7.7)
Neutrophils Relative %: 67.3 % (ref 43.0–77.0)
Platelets: 249 10*3/uL (ref 150.0–400.0)
RBC: 5.2 Mil/uL (ref 4.22–5.81)
RDW: 13.6 % (ref 11.5–15.5)
WBC: 5.1 10*3/uL (ref 4.0–10.5)

## 2021-03-22 LAB — HEMOGLOBIN A1C: Hgb A1c MFr Bld: 6.2 % (ref 4.6–6.5)

## 2021-03-22 LAB — TSH: TSH: 1.21 u[IU]/mL (ref 0.35–4.50)

## 2021-03-22 NOTE — Patient Instructions (Addendum)
Please return in 3-6 months for your annual complete physical; please come fasting.   I will release your lab results to you on your MyChart account with further instructions. Please reply with any questions.   I have placed referrals to pulmonology to get you set up for a sleep study due to your snoring and a cardiac stress test due to your chest pain and shortness of breath. We will call you to get you set up for both.  If you have any questions or concerns, please don't hesitate to send me a message via MyChart or call the office at [phone]. Thank you for visiting with [place] today! It's our pleasure caring for you.   Nonspecific Chest Pain, Adult Chest pain can be caused by many different conditions. It can be caused by a condition that is life-threatening and requires treatment right away. It can also be caused by something that is not life-threatening. If you have chest pain, it can be hard to know the difference, so it is important to get helpright away to make sure that you do not have a serious condition. Some life-threatening causes of chest pain include: Heart attack. A tear in the body's main blood vessel (aortic dissection). Inflammation around your heart (pericarditis). A problem in the lungs, such as a blood clot (pulmonary embolism) or a collapsed lung (pneumothorax). Some non life-threatening causes of chest pain include: Heartburn. Anxiety or stress. Damage to the bones, muscles, and cartilage that make up your chest wall. Pneumonia or bronchitis. Shingles infection (varicella-zoster virus). Chest pain can feel like: Pain or discomfort on the surface of your chest or deep in your chest. Crushing, pressure, aching, or squeezing pain. Burning or tingling. Dull or sharp pain that is worse when you move, cough, or take a deep breath. Pain or discomfort that is also felt in your back, neck, jaw, shoulder, or arm, or pain that spreads to any of these areas. Your chest pain may  come and go. It may also be constant. Your health care provider will do lab tests and other studies to find the cause of your pain.Treatment will depend on the cause of your chest pain. Follow these instructions at home: Medicines Take over-the-counter and prescription medicines only as told by your health care provider. If you were prescribed an antibiotic, take it as told by your health care provider. Do not stop taking the antibiotic even if you start to feel better. Lifestyle  Rest as directed by your health care provider. Do not use any products that contain nicotine or tobacco, such as cigarettes and e-cigarettes. If you need help quitting, ask your health care provider. Do not drink alcohol. Make healthy lifestyle choices as recommended. These may include: Getting regular exercise. Ask your health care provider to suggest some activities that are safe for you. Eating a heart-healthy diet. This includes plenty of fresh fruits and vegetables, whole grains, low-fat (lean) protein, and low-fat dairy products. A dietitian can help you find healthy eating options. Maintaining a healthy weight. Managing any other health conditions you have, such as high blood pressure (hypertension) or diabetes. Reducing stress, such as with yoga or relaxation techniques.  General instructions Pay attention to any changes in your symptoms. Tell your health care provider about them or any new symptoms. Avoid any activities that cause chest pain. Keep all follow-up visits as told by your health care provider. This is important. This includes visits for any further testing if your chest pain does not go away.  Contact a health care provider if: Your chest pain does not go away. You feel depressed. You have a fever. Get help right away if: Your chest pain gets worse. You have a cough that gets worse, or you cough up blood. You have severe pain in your abdomen. You faint. You have sudden, unexplained chest  discomfort. You have sudden, unexplained discomfort in your arms, back, neck, or jaw. You have shortness of breath at any time. You suddenly start to sweat, or your skin gets clammy. You feel nausea or you vomit. You suddenly feel lightheaded or dizzy. You have severe weakness, or unexplained weakness or fatigue. Your heart begins to beat quickly, or it feels like it is skipping beats. These symptoms may represent a serious problem that is an emergency. Do not wait to see if the symptoms will go away. Get medical help right away. Call your local emergency services (911 in the [place] Do not drive yourself to the hospital. Summary Chest pain can be caused by a condition that is serious and requires urgent treatment. It may also be caused by something that is not life-threatening. If you have chest pain, it is very important to see your health care provider. Your health care provider may do lab tests and other studies to find the cause of your pain. Follow your health care provider's instructions on taking medicines, making lifestyle changes, and getting emergency treatment if symptoms become worse. Keep all follow-up visits as told by your health care provider. This includes visits for any further testing if your chest pain does not go away. This information is not intended to replace advice given to you by your health care provider. Make sure you discuss any questions you have with your healthcare provider. Document Revised: 03/25/2018 Document Reviewed: 03/25/2018 Elsevier Patient Education  2022 [employer].
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
## 2021-03-26 LAB — LIPID PANEL
Cholesterol: 206 — AB (ref 0–200)
HDL: 50 (ref 35–70)
LDL Cholesterol: 127

## 2021-03-26 LAB — BASIC METABOLIC PANEL: Glucose: 111

## 2021-03-27 ENCOUNTER — Telehealth: Payer: Self-pay

## 2021-03-27 NOTE — Telephone Encounter (Signed)
Patient came in the office and wanted to know the other test Dr. Jonni Sanger was requesting him to do he is getting his stress test Thursday. Patient couldn't remember what other test that was discussed.

## 2021-03-27 NOTE — Telephone Encounter (Signed)
Patient is returning phone call to [place].

## 2021-03-27 NOTE — Telephone Encounter (Signed)
Patient wishes to speak with Dr.Andy for clarification of the test being ordered. I let him know which test were ordered per his most recent office note but he wishes to get better clarification from [facility] also gave patient the phone number to [place] [phone]) to see if they are able to better explain the test to patient. Patient is unsure of what type of cardiac stress test the office does, he wants to make sure pictures will be taken of his heart.

## 2021-03-27 NOTE — Telephone Encounter (Signed)
Left voicemail for patient to return call.

## 2021-03-28 ENCOUNTER — Encounter: Payer: Self-pay | Admitting: Family Medicine

## 2021-04-02 ENCOUNTER — Ambulatory Visit (HOSPITAL_COMMUNITY): Admission: RE | Admit: 2021-04-02 | Payer: 59 | Source: Ambulatory Visit

## 2021-04-16 ENCOUNTER — Other Ambulatory Visit: Payer: Self-pay | Admitting: Family Medicine

## 2021-04-16 DIAGNOSIS — R079 Chest pain, unspecified: Secondary | ICD-10-CM

## 2021-04-17 ENCOUNTER — Other Ambulatory Visit: Payer: Self-pay

## 2021-04-17 ENCOUNTER — Ambulatory Visit: Payer: 59

## 2021-04-17 DIAGNOSIS — R079 Chest pain, unspecified: Secondary | ICD-10-CM

## 2021-07-20 ENCOUNTER — Other Ambulatory Visit: Payer: Self-pay | Admitting: Family Medicine

## 2021-10-16 IMAGING — DX DG CHEST 2V
2 series · 2 of 2 positions shown · non-contrast
Comparison: None.

CLINICAL DATA: Dyspnea on exertion

EXAM:
CHEST - 2 VIEW

[chest pa]
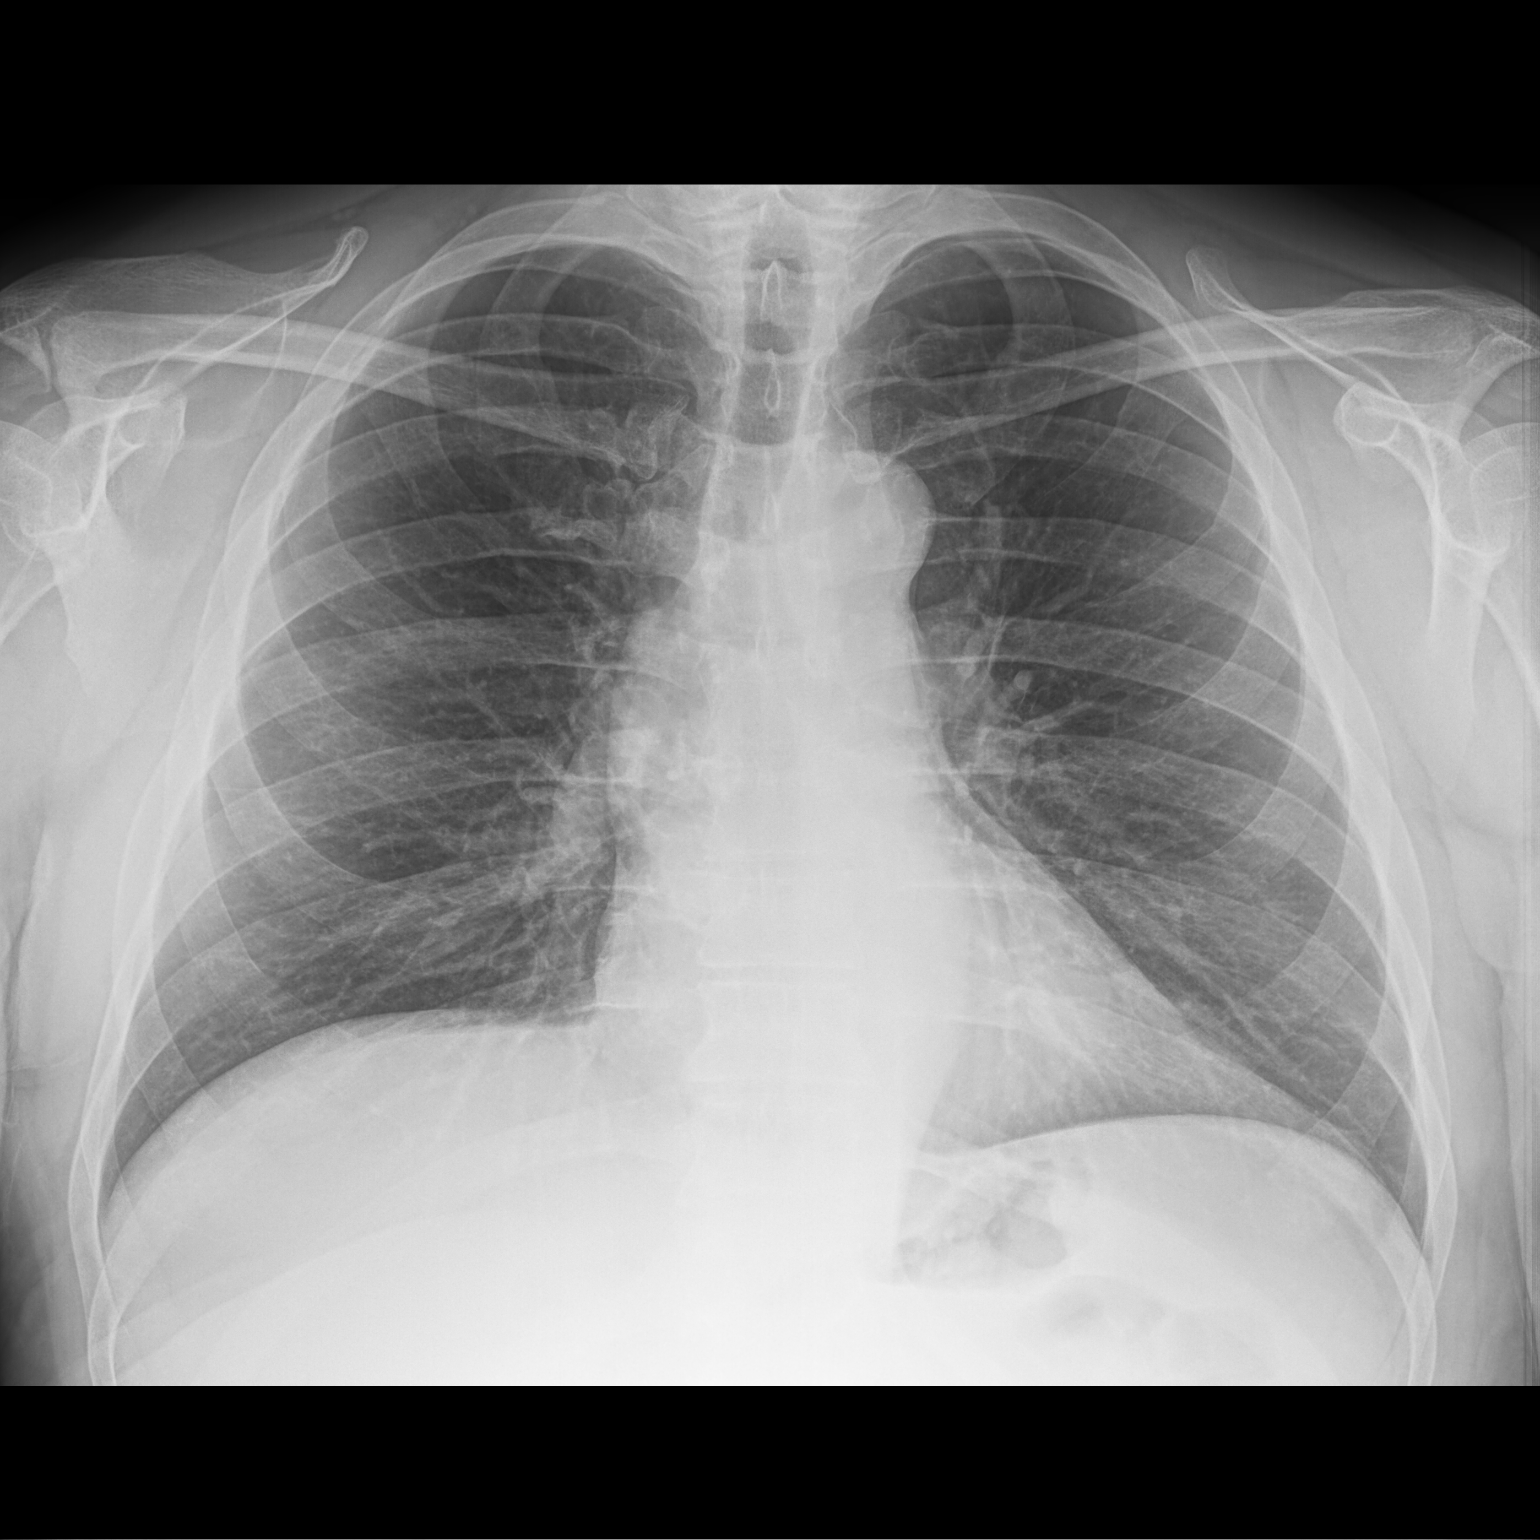

[chest lat]
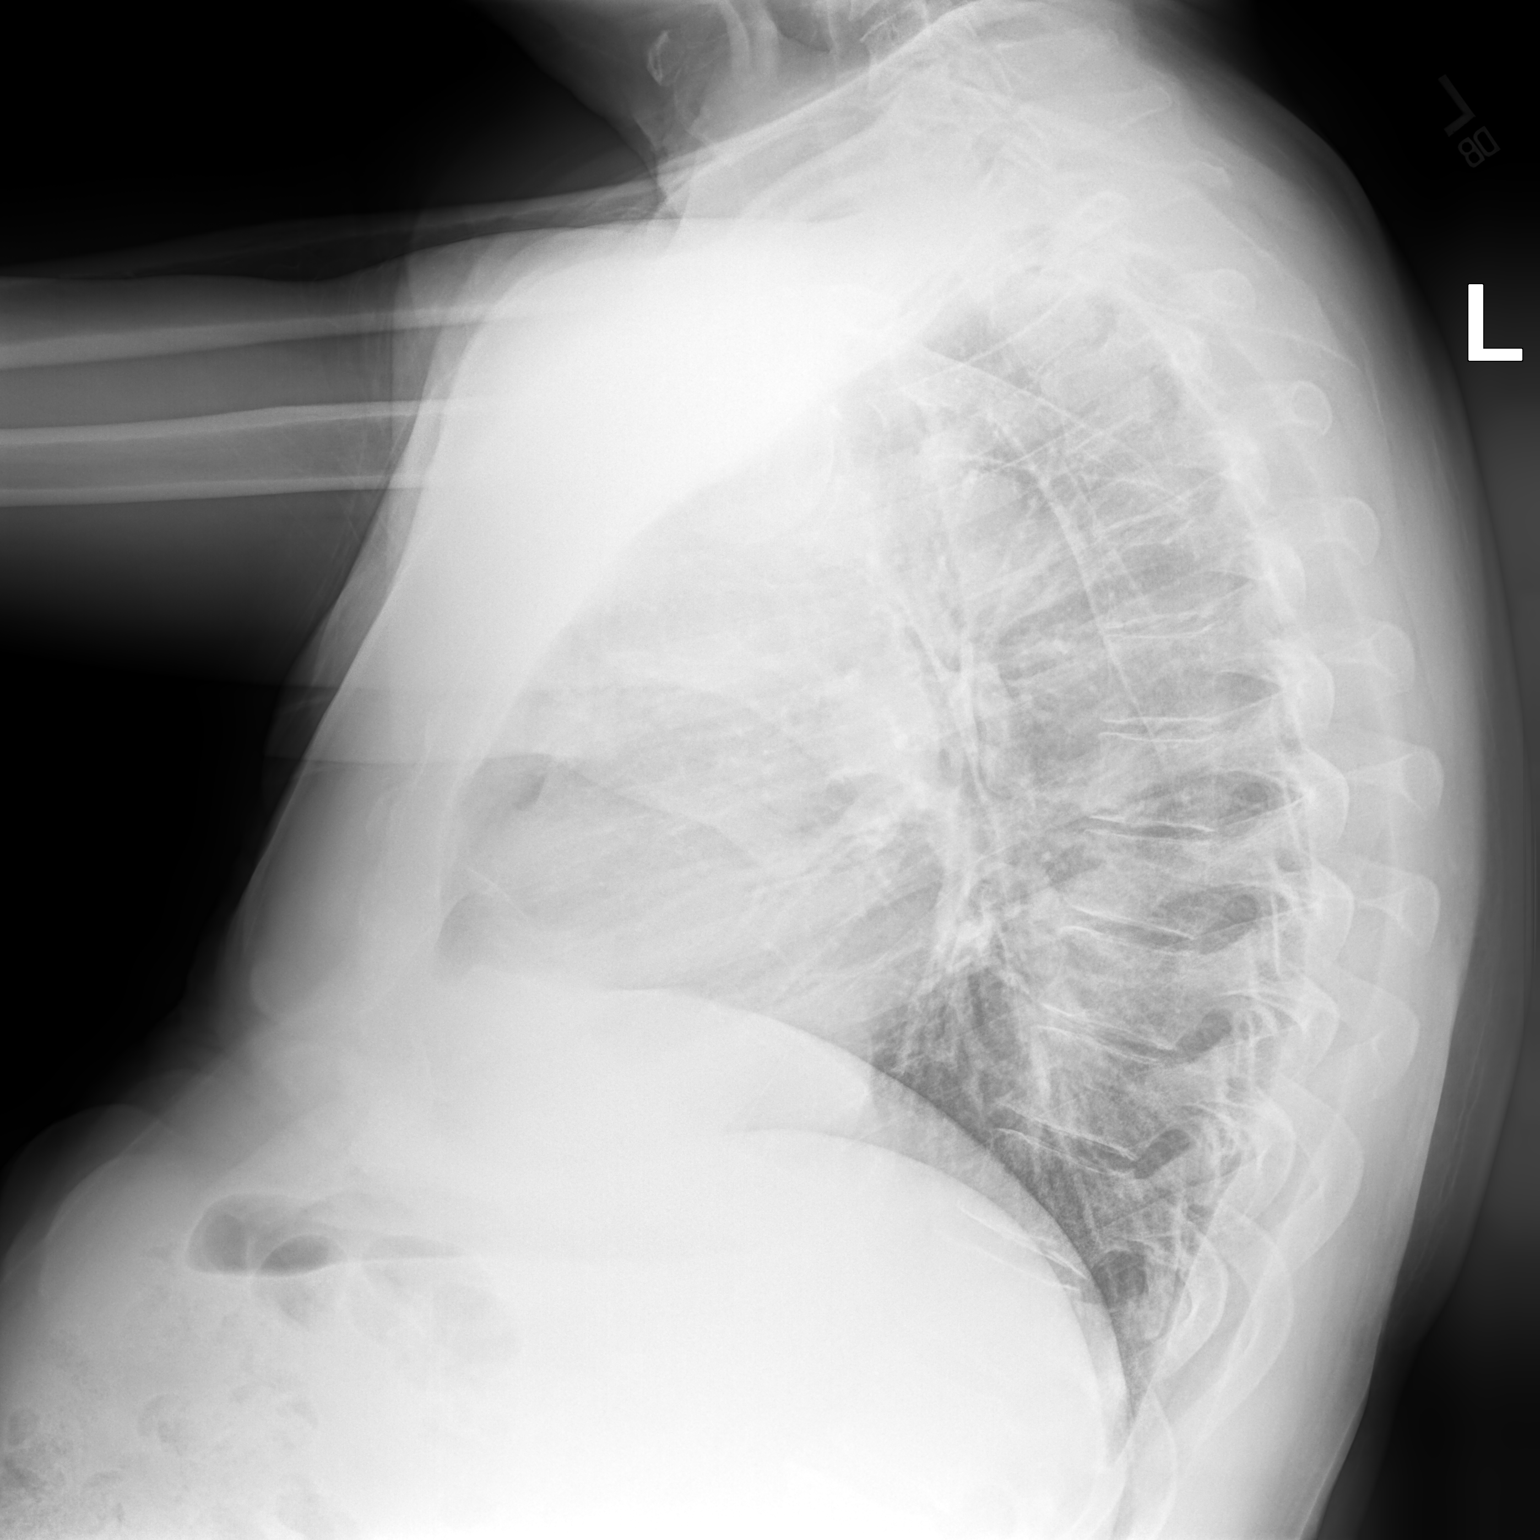

[2 of 2 positions shown; findings below may reference images not displayed]

FINDINGS: The heart size and mediastinal contours are within normal limits.
Both lungs are clear. The visualized skeletal structures are
unremarkable.
IMPRESSION: No active cardiopulmonary disease.

## 2022-04-24 ENCOUNTER — Ambulatory Visit (INDEPENDENT_AMBULATORY_CARE_PROVIDER_SITE_OTHER): Payer: 59

## 2022-04-24 ENCOUNTER — Ambulatory Visit (INDEPENDENT_AMBULATORY_CARE_PROVIDER_SITE_OTHER): Payer: 59 | Admitting: Family Medicine

## 2022-04-24 ENCOUNTER — Ambulatory Visit (HOSPITAL_BASED_OUTPATIENT_CLINIC_OR_DEPARTMENT_OTHER): Payer: 59

## 2022-04-24 ENCOUNTER — Encounter (HOSPITAL_BASED_OUTPATIENT_CLINIC_OR_DEPARTMENT_OTHER): Payer: Self-pay | Admitting: Family Medicine

## 2022-04-24 DIAGNOSIS — M25562 Pain in left knee: Secondary | ICD-10-CM

## 2022-04-24 NOTE — Patient Instructions (Signed)
  Medication Instructions:  Your physician recommends that you continue on your current medications as directed. Please refer to the Current Medication list given to you today. --If you need a refill on any your medications before your next appointment, please call your pharmacy first. If no refills are authorized on file call the office.-- Lab Work: Your physician has recommended that you have lab work today: No If you have labs (blood work) drawn today and your tests are completely normal, you will receive your results via [place] a phone call from our staff.  Please ensure you check your voicemail in the event that you authorized detailed messages to be left on a delegated number. If you have any lab test that is abnormal or we need to change your treatment, we will call you to review the results.  Referrals/Procedures/Imaging: No  Follow-Up: Your next appointment:   Your physician recommends that you schedule a follow-up appointment in: 6 weeks with Dr. de [place].  You will receive a text message or e-mail with a link to a survey about your care and experience with [place] today! We would greatly appreciate your feedback!   Thanks for letting us be apart of your health journey!!  Primary Care and Sports Medicine   Dr. Arlina Robes [place]   We encourage you to activate your patient portal called "MyChart".  Sign up information is provided on this After Visit Summary.  MyChart is used to connect with patients for Virtual Visits (Telemedicine).  Patients are able to view lab/test results, encounter notes, upcoming appointments, etc.  Non-urgent messages can be sent to your provider as well. To learn more about what you can do with MyChart, please visit --  [email or web address].

## 2022-04-24 NOTE — Assessment & Plan Note (Addendum)
Patient reports having left leg pain for about 2 months now.  Pain is primarily located near left knee, does have some symptoms distally as well as proximally.  Reports that symptoms initially began after walking more frequently.  He was regularly cycling, however due to less daylight hours, he switched to walking which is fine.  After a period of time walking, he began to notice some posterior leg pain, mostly in the area behind the left knee.  Does not recall any swelling.  Has not had any knee instability.  Pain primarily worsens with walking, deep flexion, walking downstairs.  He does not recall any specific injury, does not recall any history of left knee issues in the past. Left knee: No obvious deformity. No effusion.  Negative patellar grind.  Negative crepitus. Full ROM for flexion and extension.  Strength 5 out of 5 for flexion and extension. No pain with resisted tibial IR or ER; no pain with resisted knee flexion. Anterior drawer: Negative Posterior drawer: Negative Lachman: Negative Varus stress test: Negative Valgus stress test: Negative McMurray's: Negative Thessaly: Negative Apley's compression test: Negative Neurovascularly intact.  No evidence of lymphatic disease.  Given history and exam, feel that symptoms are most likely attributable to degenerative changes of the left knee, likely some underlying osteoarthritis, possibly with degenerative meniscal tearing as well.  Seems to be less likely related to a soft tissue injury based on exam Discussed options with patient, will proceed with initial x-ray imaging, referral to physical therapy Likely would consider steroid injection into the left knee, particularly if there is evidence of underlying osteoarthritis on imaging Recommend home exercises as per PT.  Can continue with activities as tolerated, discussed varying activities to allow for some offloading of lower extremity joints, particularly with utilization of cycling,  swimming We will plan for follow-up in about 6 weeks or sooner as needed.  Did discuss that if desired, can return to clinic sooner for steroid injection once x-rays completed

## 2022-04-24 NOTE — Progress Notes (Signed)
    Procedures performed today:    None.  Independent interpretation of notes and tests performed by another provider:   None.  Brief History, Exam, Impression, and Recommendations:    BP 113/79   Pulse 85   Ht 6' (1.829 m)   Wt 240 lb 9.6 oz (109.1 kg)   SpO2 100%   BMI 32.63 kg/m   Left knee pain Patient reports having left leg pain for about 2 months now.  Pain is primarily located near left knee, does have some symptoms distally as well as proximally.  Reports that symptoms initially began after walking more frequently.  He was regularly cycling, however due to less daylight hours, he switched to walking which is fine.  After a period of time walking, he began to notice some posterior leg pain, mostly in the area behind the left knee.  Does not recall any swelling.  Has not had any knee instability.  Pain primarily worsens with walking, deep flexion, walking downstairs.  He does not recall any specific injury, does not recall any history of left knee issues in the past. Left knee: No obvious deformity. No effusion.  Negative patellar grind.  Negative crepitus. Full ROM for flexion and extension.  Strength 5 out of 5 for flexion and extension. No pain with resisted tibial IR or ER; no pain with resisted knee flexion. Anterior drawer: Negative Posterior drawer: Negative Lachman: Negative Varus stress test: Negative Valgus stress test: Negative McMurray's: Negative Thessaly: Negative Apley's compression test: Negative Neurovascularly intact.  No evidence of lymphatic disease.  Given history and exam, feel that symptoms are most likely attributable to degenerative changes of the left knee, likely some underlying osteoarthritis, possibly with degenerative meniscal tearing as well.  Seems to be less likely related to a soft tissue injury based on exam Discussed options with patient, will proceed with initial x-ray imaging, referral to physical therapy Likely would consider steroid  injection into the left knee, particularly if there is evidence of underlying osteoarthritis on imaging Recommend home exercises as per PT.  Can continue with activities as tolerated, discussed varying activities to allow for some offloading of lower extremity joints, particularly with utilization of cycling, swimming We will plan for follow-up in about 6 weeks or sooner as needed.  Did discuss that if desired, can return to clinic sooner for steroid injection once x-rays completed  Return in about 6 weeks (around 06/05/2022) for Left knee pain.   ___________________________________________ Rondel Episcopo de [place], MD, ABFM, [facility] and [place]

## 2022-05-12 ENCOUNTER — Ambulatory Visit (HOSPITAL_BASED_OUTPATIENT_CLINIC_OR_DEPARTMENT_OTHER): Payer: 59 | Attending: Family Medicine | Admitting: Physical Therapy

## 2022-05-12 ENCOUNTER — Encounter (HOSPITAL_BASED_OUTPATIENT_CLINIC_OR_DEPARTMENT_OTHER): Payer: Self-pay | Admitting: Physical Therapy

## 2022-05-12 ENCOUNTER — Other Ambulatory Visit: Payer: Self-pay

## 2022-05-12 DIAGNOSIS — M6281 Muscle weakness (generalized): Secondary | ICD-10-CM | POA: Diagnosis present

## 2022-05-12 DIAGNOSIS — G8929 Other chronic pain: Secondary | ICD-10-CM | POA: Diagnosis present

## 2022-05-12 DIAGNOSIS — R262 Difficulty in walking, not elsewhere classified: Secondary | ICD-10-CM | POA: Insufficient documentation

## 2022-05-12 DIAGNOSIS — M25562 Pain in left knee: Secondary | ICD-10-CM | POA: Insufficient documentation
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
## 2022-05-28 ENCOUNTER — Encounter (HOSPITAL_BASED_OUTPATIENT_CLINIC_OR_DEPARTMENT_OTHER): Payer: Self-pay | Admitting: Physical Therapy

## 2022-05-28 ENCOUNTER — Ambulatory Visit (HOSPITAL_BASED_OUTPATIENT_CLINIC_OR_DEPARTMENT_OTHER): Payer: 59 | Admitting: Physical Therapy

## 2022-05-28 DIAGNOSIS — R262 Difficulty in walking, not elsewhere classified: Secondary | ICD-10-CM

## 2022-05-28 DIAGNOSIS — G8929 Other chronic pain: Secondary | ICD-10-CM

## 2022-05-28 DIAGNOSIS — M25562 Pain in left knee: Secondary | ICD-10-CM | POA: Diagnosis not present

## 2022-05-28 DIAGNOSIS — M6281 Muscle weakness (generalized): Secondary | ICD-10-CM
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
## 2022-06-04 ENCOUNTER — Ambulatory Visit (INDEPENDENT_AMBULATORY_CARE_PROVIDER_SITE_OTHER): Payer: 59 | Admitting: Family Medicine

## 2022-06-04 ENCOUNTER — Encounter (HOSPITAL_BASED_OUTPATIENT_CLINIC_OR_DEPARTMENT_OTHER): Payer: Self-pay | Admitting: Family Medicine

## 2022-06-04 VITALS — BP 133/81 | HR 70 | Ht 72.0 in | Wt 241.9 lb

## 2022-06-04 DIAGNOSIS — M25562 Pain in left knee: Secondary | ICD-10-CM

## 2022-06-04 NOTE — Progress Notes (Signed)
    Procedures performed today:    None.  Independent interpretation of notes and tests performed by another provider:   None.  Brief History, Exam, Impression, and Recommendations:    BP 133/81   Pulse 70   Ht 6' (1.829 m)   Wt 241 lb 14.4 oz (109.7 kg)   SpO2 100%   BMI 32.81 kg/m   Left knee pain Patient presents for follow-up of left knee pain.  He reports that generally he has been doing well.  He has been working with physical therapy which he has found to be helpful.  Has also been doing home exercises as per PT he had x-rays completed since last visit which were generally reassuring without any acute bony abnormality noted.  He continues with some of his desired activities including cycling, walking.  He has kept intensity for his activities low, primarily sticking to level ground walking and lower intensity cycling.  He generally has not had significant pain since last visit.  Feels that his range of motion in the left knee has improved, notably with knee flexion which was more difficult for him prior to physical therapy.  His main concerns today are related to potential return of symptoms when he tries to progress his activities Feel that it would be appropriate for patient to look to gradually increase his activities.  Recommend gradual increase in volume and then looking to increase intensity/complexity.  Would be reasonable to continue with cycling and in regards to his walking he would plan to begin walking with a group of friends again which may involve more hills and inclines/declines.  Feel that it would be reasonable to progress these activities and monitor symptoms.  Would do so in a gradual way.  Would continue with physical therapy and home exercises as per PT We will plan for follow-up as needed.  Discussed that if upon progress activities, pain returns or any worsening is noted, he may return to the office at any time and we can further discuss options which may include  corticosteroid injection or possible advanced imaging with MRI  Return if symptoms worsen or fail to improve, for left knee.   ___________________________________________ Margaretmary Prisk de [place], MD, ABFM, [facility] and [place]

## 2022-06-04 NOTE — Assessment & Plan Note (Signed)
Patient presents for follow-up of left knee pain.  He reports that generally he has been doing well.  He has been working with physical therapy which he has found to be helpful.  Has also been doing home exercises as per PT he had x-rays completed since last visit which were generally reassuring without any acute bony abnormality noted.  He continues with some of his desired activities including cycling, walking.  He has kept intensity for his activities low, primarily sticking to level ground walking and lower intensity cycling.  He generally has not had significant pain since last visit.  Feels that his range of motion in the left knee has improved, notably with knee flexion which was more difficult for him prior to physical therapy.  His main concerns today are related to potential return of symptoms when he tries to progress his activities Feel that it would be appropriate for patient to look to gradually increase his activities.  Recommend gradual increase in volume and then looking to increase intensity/complexity.  Would be reasonable to continue with cycling and in regards to his walking he would plan to begin walking with a group of friends again which may involve more hills and inclines/declines.  Feel that it would be reasonable to progress these activities and monitor symptoms.  Would do so in a gradual way.  Would continue with physical therapy and home exercises as per PT We will plan for follow-up as needed.  Discussed that if upon progress activities, pain returns or any worsening is noted, he may return to the office at any time and we can further discuss options which may include corticosteroid injection or possible advanced imaging with MRI

## 2022-06-04 NOTE — Patient Instructions (Signed)
  Medication Instructions:  Your physician recommends that you continue on your current medications as directed. Please refer to the Current Medication list given to you today. --If you need a refill on any your medications before your next appointment, please call your pharmacy first. If no refills are authorized on file call the office.-- Lab Work: Your physician has recommended that you have lab work today: No If you have labs (blood work) drawn today and your tests are completely normal, you will receive your results via [place] a phone call from our staff.  Please ensure you check your voicemail in the event that you authorized detailed messages to be left on a delegated number. If you have any lab test that is abnormal or we need to change your treatment, we will call you to review the results.  Referrals/Procedures/Imaging: No  Follow-Up: Your next appointment:   Your physician recommends that you schedule a follow-up appointment as needed (prn) with Dr. de [place].  You will receive a text message or e-mail with a link to a survey about your care and experience with [place] today! We would greatly appreciate your feedback!   Thanks for letting us be apart of your health journey!!  Primary Care and Sports Medicine   Dr. Arlina Robes [place]   We encourage you to activate your patient portal called "MyChart".  Sign up information is provided on this After Visit Summary.  MyChart is used to connect with patients for Virtual Visits (Telemedicine).  Patients are able to view lab/test results, encounter notes, upcoming appointments, etc.  Non-urgent messages can be sent to your provider as well. To learn more about what you can do with MyChart, please visit --  [email or web address].

## 2022-06-16 ENCOUNTER — Encounter (HOSPITAL_BASED_OUTPATIENT_CLINIC_OR_DEPARTMENT_OTHER): Payer: Self-pay | Admitting: Physical Therapy

## 2022-06-16 ENCOUNTER — Ambulatory Visit (HOSPITAL_BASED_OUTPATIENT_CLINIC_OR_DEPARTMENT_OTHER): Payer: 59 | Attending: Family Medicine | Admitting: Physical Therapy

## 2022-06-16 DIAGNOSIS — G8929 Other chronic pain: Secondary | ICD-10-CM | POA: Insufficient documentation

## 2022-06-16 DIAGNOSIS — M25562 Pain in left knee: Secondary | ICD-10-CM | POA: Insufficient documentation

## 2022-06-16 DIAGNOSIS — R262 Difficulty in walking, not elsewhere classified: Secondary | ICD-10-CM | POA: Insufficient documentation

## 2022-06-16 DIAGNOSIS — M6281 Muscle weakness (generalized): Secondary | ICD-10-CM | POA: Insufficient documentation
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
## 2022-06-23 ENCOUNTER — Ambulatory Visit (HOSPITAL_BASED_OUTPATIENT_CLINIC_OR_DEPARTMENT_OTHER): Payer: 59 | Admitting: Physical Therapy

## 2022-06-30 ENCOUNTER — Encounter: Payer: Self-pay | Admitting: *Deleted

## 2022-07-22 ENCOUNTER — Ambulatory Visit (HOSPITAL_BASED_OUTPATIENT_CLINIC_OR_DEPARTMENT_OTHER): Payer: 59 | Admitting: Physical Therapy

## 2022-07-24 ENCOUNTER — Encounter (HOSPITAL_BASED_OUTPATIENT_CLINIC_OR_DEPARTMENT_OTHER): Payer: Self-pay | Admitting: Physical Therapy

## 2022-07-24 ENCOUNTER — Ambulatory Visit (HOSPITAL_BASED_OUTPATIENT_CLINIC_OR_DEPARTMENT_OTHER): Payer: 59 | Attending: Family Medicine | Admitting: Physical Therapy

## 2022-07-24 DIAGNOSIS — M25562 Pain in left knee: Secondary | ICD-10-CM | POA: Insufficient documentation

## 2022-07-24 DIAGNOSIS — R262 Difficulty in walking, not elsewhere classified: Secondary | ICD-10-CM | POA: Diagnosis present

## 2022-07-24 DIAGNOSIS — G8929 Other chronic pain: Secondary | ICD-10-CM | POA: Diagnosis present

## 2022-07-24 DIAGNOSIS — M6281 Muscle weakness (generalized): Secondary | ICD-10-CM | POA: Insufficient documentation
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
## 2022-08-01 ENCOUNTER — Other Ambulatory Visit: Payer: Self-pay | Admitting: Family Medicine

## 2022-08-05 ENCOUNTER — Other Ambulatory Visit: Payer: Self-pay | Admitting: Family Medicine

## 2022-08-13 ENCOUNTER — Ambulatory Visit (INDEPENDENT_AMBULATORY_CARE_PROVIDER_SITE_OTHER): Payer: 59 | Admitting: Family Medicine

## 2022-08-13 ENCOUNTER — Encounter: Payer: Self-pay | Admitting: Family Medicine

## 2022-08-13 VITALS — BP 124/78 | HR 73 | Temp 97.8°F | Ht 72.0 in | Wt 244.6 lb

## 2022-08-13 DIAGNOSIS — E782 Mixed hyperlipidemia: Secondary | ICD-10-CM

## 2022-08-13 DIAGNOSIS — J45990 Exercise induced bronchospasm: Secondary | ICD-10-CM

## 2022-08-13 DIAGNOSIS — Z8249 Family history of ischemic heart disease and other diseases of the circulatory system: Secondary | ICD-10-CM

## 2022-08-13 DIAGNOSIS — Z125 Encounter for screening for malignant neoplasm of prostate: Secondary | ICD-10-CM

## 2022-08-13 DIAGNOSIS — E669 Obesity, unspecified: Secondary | ICD-10-CM

## 2022-08-13 DIAGNOSIS — Z0000 Encounter for general adult medical examination without abnormal findings: Secondary | ICD-10-CM

## 2022-08-13 DIAGNOSIS — K219 Gastro-esophageal reflux disease without esophagitis: Secondary | ICD-10-CM

## 2022-08-13 DIAGNOSIS — M170 Bilateral primary osteoarthritis of knee: Secondary | ICD-10-CM

## 2022-08-13 DIAGNOSIS — Z23 Encounter for immunization: Secondary | ICD-10-CM | POA: Diagnosis not present

## 2022-08-13 LAB — COMPREHENSIVE METABOLIC PANEL
ALT: 15 U/L (ref 0–53)
AST: 14 U/L (ref 0–37)
Albumin: 4.4 g/dL (ref 3.5–5.2)
Alkaline Phosphatase: 47 U/L (ref 39–117)
BUN: 17 mg/dL (ref 6–23)
CO2: 28 mEq/L (ref 19–32)
Calcium: 9.5 mg/dL (ref 8.4–10.5)
Chloride: 103 mEq/L (ref 96–112)
Creatinine, Ser: 0.87 mg/dL (ref 0.40–1.50)
GFR: 92.91 mL/min (ref >60.00)
Glucose, Bld: 110 mg/dL — ABNORMAL HIGH (ref 70–99)
Potassium: 4.3 mEq/L (ref 3.5–5.1)
Sodium: 138 mEq/L (ref 135–145)
Total Bilirubin: 0.8 mg/dL (ref 0.2–1.2)
Total Protein: 6.6 g/dL (ref 6.0–8.3)

## 2022-08-13 LAB — CBC WITH DIFFERENTIAL/PLATELET
Basophils Absolute: 0 10*3/uL (ref 0.0–0.1)
Basophils Relative: 0.5 % (ref 0.0–3.0)
Eosinophils Absolute: 0.3 10*3/uL (ref 0.0–0.7)
Eosinophils Relative: 5.1 % — ABNORMAL HIGH (ref 0.0–5.0)
HCT: 40.6 % (ref 39.0–52.0)
Hemoglobin: 13.7 g/dL (ref 13.0–17.0)
Lymphocytes Relative: 24.3 % (ref 12.0–46.0)
Lymphs Abs: 1.3 10*3/uL (ref 0.7–4.0)
MCHC: 33.7 g/dL (ref 30.0–36.0)
MCV: 82.6 fl (ref 78.0–100.0)
Monocytes Absolute: 0.3 10*3/uL (ref 0.1–1.0)
Monocytes Relative: 5.2 % (ref 3.0–12.0)
Neutro Abs: 3.4 10*3/uL (ref 1.4–7.7)
Neutrophils Relative %: 64.9 % (ref 43.0–77.0)
Platelets: 254 10*3/uL (ref 150.0–400.0)
RBC: 4.92 Mil/uL (ref 4.22–5.81)
RDW: 13.2 % (ref 11.5–15.5)
WBC: 5.2 10*3/uL (ref 4.0–10.5)

## 2022-08-13 LAB — HEMOGLOBIN A1C: Hgb A1c MFr Bld: 6.3 % (ref 4.6–6.5)

## 2022-08-13 LAB — LIPID PANEL
Cholesterol: 222 mg/dL — ABNORMAL HIGH (ref 0–200)
HDL: 51.4 mg/dL (ref >39.00)
NonHDL: 171
Total CHOL/HDL Ratio: 4
Triglycerides: 223 mg/dL — ABNORMAL HIGH (ref 0.0–149.0)
VLDL: 44.6 mg/dL — ABNORMAL HIGH (ref 0.0–40.0)

## 2022-08-13 LAB — TSH: TSH: 0.98 u[IU]/mL (ref 0.35–5.50)

## 2022-08-13 LAB — LDL CHOLESTEROL, DIRECT: Direct LDL: 147 mg/dL

## 2022-08-13 LAB — PSA: PSA: 3.96 ng/mL (ref 0.10–4.00)

## 2022-08-13 MED ORDER — PHENTERMINE HCL 37.5 MG PO TABS: 18.2500 mg | ORAL_TABLET | Freq: Every day | ORAL | 2 refills | Status: DC

## 2022-08-13 NOTE — Progress Notes (Signed)
Subjective  Chief Complaint  Patient presents with   Annual Exam    Pt here for annual Exam and is currently fasting     HPI: Joseph Gaines is a 61 y.o. male who presents to [facility] today for a Male Wellness Visit. He also has the concerns and/or needs as listed above in the chief complaint. These will be addressed in addition to the Health Maintenance Visit.   Wellness Visit: annual visit with health maintenance review and exam   Health maintenance: Screens are current.  Healthy lifestyle, discussed diet and exercise in detail.  Eligible for flu vaccines and Shingrix.  Body mass index is 33.17 kg/m. Wt Readings from Last 3 Encounters:  08/13/22 244 lb 9.6 oz (110.9 kg)  06/04/22 241 lb 14.4 oz (109.7 kg)  04/24/22 240 lb 9.6 oz (109.1 kg)     Chronic disease management visit and/or acute problem visit: Obesity: Weight continues to trend upwards.  Last year he worked hard on exercise, was walking and bicycling daily.  Scale did not move.  He tries to eat healthy.  Tends to eat breakfast consisting of eggs occasional bacon and English muffin.  Will tend to eat when she is out for his job.  Tries to eat salads and healthy.  Does admit to snacking.  Dinners are white.  Eats a lot of chicken and fish.  Will drink 1-2 beers per night.  Rarely eats dessert.  He did join the gym 2 months ago and is doing light weight training and walking.  He denies significant fatigue.  Libido is fine.  No edema.  Sleep is normal.  Denies symptoms of hyperglycemia Left knee pain, osteoarthritis.  Reviewed sports medicine's evaluation and treatments.  He had to back off exercise No problems with his asthma recently. Hyperlipidemia on simvastatin 40 nightly.  Tolerating well.  Due for recheck GERD is well controlled on omeprazole 20. Reviewed family history of heart disease again and negative stress testing done last year.  He denies any further episodes of atypical chest  pain.  Patient Active Problem List   Diagnosis Date Noted   Primary osteoarthritis of both knees 08/13/2022   Left knee pain 04/24/2022   Epidermal cyst 09/19/2020   Obesity (BMI 30-39.9) 04/12/2020   Seasonal allergic rhinitis due to pollen 12/25/2017   Exercise-induced asthma 01/15/2015   Family history of premature CAD 01/15/2015   Mixed hyperlipidemia 07/28/2007   GERD (gastroesophageal reflux disease) 06/30/2007   Health Maintenance  Topic Date Due   Zoster Vaccines- Shingrix (1 of 2) Never done   INFLUENZA VACCINE  05/06/2022   COVID-19 Vaccine (3 - Pfizer series) 08/29/2022 (Originally 03/13/2020)   TETANUS/TDAP  12/02/2022   COLONOSCOPY (Pts 45-18yr Insurance coverage will need to be confirmed)  12/04/2024   Hepatitis C Screening  Completed   HIV Screening  Completed   HPV VACCINES  Aged Out   Immunization History  Administered Date(s) Administered   Influenza,inj,Quad PF,6+ Mos 08/05/2016, 09/19/2020   PFIZER(Purple Top)SARS-COV-2 Vaccination 12/20/2019, 01/17/2020   Tdap 12/02/2012   We updated and reviewed the patient's past history in detail and it is documented below. Allergies: Patient has No Known Allergies. Past Medical History  has a past medical history of Asthma and Hyperlipidemia. Past Surgical History Patient  has a past surgical history that includes skin grafts (age 61 and Tonsillectomy (1968). Social History Patient  reports that he quit smoking about 33 years ago. His smoking use included cigarettes.  He has quit using smokeless tobacco. He reports current alcohol use of about 2.0 standard drinks of alcohol per week. He reports that he does not use drugs. Family History family history includes Aortic aneurysm in his mother; Asthma in his father; COPD in his mother; Cancer in his father; Early death (age of onset: 42) in his father; Hearing loss in his father; Heart disease (age of onset: 27) in his father; Heart disease (age of onset: 64) in his mother;  Hyperlipidemia in his brother; Hypertension in his father. Review of Systems: Constitutional: negative for fever or malaise Ophthalmic: negative for photophobia, double vision or loss of vision Cardiovascular: negative for chest pain, dyspnea on exertion, or new LE swelling Respiratory: negative for SOB or persistent cough Gastrointestinal: negative for abdominal pain, change in bowel habits or melena Genitourinary: negative for dysuria or gross hematuria Musculoskeletal: negative for new gait disturbance or muscular weakness Integumentary: negative for new or persistent rashes Neurological: negative for TIA or stroke symptoms Psychiatric: negative for SI or delusions Allergic/Immunologic: negative for hives  Patient Care Team    Relationship Specialty Notifications Start End  Leamon Arnt, MD PCP - General Family Medicine  12/25/17    Objective  Vitals: BP 124/78   Pulse 73   Temp 97.8 F (36.6 C)   Ht 6' (1.829 m)   Wt 244 lb 9.6 oz (110.9 kg)   SpO2 95%   BMI 33.17 kg/m  General:  Well developed, well nourished, no acute distress  Psych:  Alert and orientedx3,normal mood and affect HEENT:  Normocephalic, atraumatic, non-icteric sclera, PERRL, oropharynx is clear without mass or exudate, supple neck without adenopathy, mass or thyromegaly Cardiovascular:  Normal S1, S2, RRR without gallop, rub or murmur, nondisplaced PMI, +2 distal pulses in bilateral upper and lower extremities.,  No edema Respiratory:  Good breath sounds bilaterally, CTAB with normal respiratory effort Gastrointestinal: normal bowel sounds, soft, non-tender, no noted masses. No HSM MSK: no deformities, contusions. Joints are without erythema or swelling. Spine and CVA region are nontender Skin: Dry, warm, no rashes or suspicious lesions noted Neurologic:    Mental status is normal. CN 2-11 are normal. Gross motor and sensory exams are normal. Stable gait. No tremor GU: No inguinal hernias or adenopathy are  appreciated bilaterally   Assessment  1. Annual physical exam   2. Mixed hyperlipidemia   3. Family history of premature CAD   4. Exercise-induced asthma   5. Obesity (BMI 30-39.9)   6. Prostate cancer screening   7. Primary osteoarthritis of both knees   8. Gastroesophageal reflux disease, unspecified whether esophagitis present   .  She currently   Plan  Male Wellness Visit: Age appropriate Health Maintenance and Prevention measures were discussed with patient. Included topics are cancer screening recommendations, ways to keep healthy (see AVS) including dietary and exercise recommendations, regular eye and dental care, use of seat belts, and avoidance of moderate alcohol use and tobacco use.  BMI: discussed patient's BMI and encouraged positive lifestyle modifications to help get to or maintain a target BMI.   HM needs and immunizations were addressed and ordered. See below for orders. See HM and immunization section for updates. Routine labs and screening tests ordered including cmp, cbc and lipids where appropriate. Discussed recommendations regarding Vit D and calcium supplementation (see AVS)  Chronic disease f/u and/or acute problem visit: (deemed necessary to be done in addition to the wellness visit): Obesity:We discussed increased strength training, some cardio and adjustments in his  diet.  We will add phentermine.  We discussed appropriate expectations and use.  We discussed side effects.  He will reassess here in 3 months if needed.  Screen for diabetes.  Screen for hypotestosteronism Hyperlipidemia: Recheck fasting lipids and LFTs.  Well-tolerated.  Continue simvastatin 40 nightly.  Adjust if needed GERD well-controlled omeprazole 20 daily Pain: Improving on glucosamine.  Stable.  Limits exercises that stressed knee joints. Asthma: Stable.  Rare albuterol use.  Follow up: 3 months for weight check Commons side effects, risks, benefits, and alternatives for medications and  treatment plan prescribed today were discussed, and the patient expressed understanding of the given instructions. Patient is instructed to call or message via MyChart if he/she has any questions or concerns regarding our treatment plan. No barriers to understanding were identified. We discussed Red Flag symptoms and signs in detail. Patient expressed understanding regarding what to do in case of urgent or emergency type symptoms.  Medication list was reconciled, printed and provided to the patient in AVS. Patient instructions and summary information was reviewed with the patient as documented in the AVS. This note was prepared with assistance of Dragon voice recognition software. Occasional wrong-word or sound-a-like substitutions may have occurred due to the inherent limitations of voice recognition software    Orders Placed This Encounter  Procedures   CBC with Differential/Platelet   Comprehensive metabolic panel   Lipid panel   TSH   Hemoglobin A1c   Testosterone,Free and Total   PSA   Meds ordered this encounter  Medications   phentermine (ADIPEX-P) 37.5 MG tablet    Sig: Take 0.5-1 tablets (18.75-37.5 mg total) by mouth daily before breakfast.    Dispense:  30 tablet    Refill:  2

## 2022-08-13 NOTE — Patient Instructions (Signed)
Please return in 3 months for recheck  I will release your lab results to you on your MyChart account with further instructions. You may see the results before I do, but when I review them I will send you a message with my report or have my assistant call you if things need to be discussed. Please reply to my message with any questions. Thank you!   If you have any questions or concerns, please don't hesitate to send me a message via MyChart or call the office at [phone]. Thank you for visiting with [place] today! It's our pleasure caring for you.

## 2022-08-16 LAB — TESTOSTERONE,FREE AND TOTAL
Testosterone, Free: 5.3 pg/mL — ABNORMAL LOW (ref 6.6–18.1)
Testosterone: 214 ng/dL — ABNORMAL LOW (ref 264–916)

## 2022-08-18 ENCOUNTER — Other Ambulatory Visit: Payer: Self-pay

## 2022-08-18 DIAGNOSIS — E7800 Pure hypercholesterolemia, unspecified: Secondary | ICD-10-CM

## 2022-08-18 DIAGNOSIS — Z7689 Persons encountering health services in other specified circumstances: Secondary | ICD-10-CM

## 2022-08-18 MED ORDER — ROSUVASTATIN CALCIUM 10 MG PO TABS: 10.0000 mg | ORAL_TABLET | Freq: Every evening | ORAL | 3 refills | Status: DC

## 2022-10-30 ENCOUNTER — Other Ambulatory Visit: Payer: Self-pay | Admitting: Family Medicine

## 2022-11-26 ENCOUNTER — Ambulatory Visit: Payer: 59 | Admitting: Family Medicine

## 2022-12-05 ENCOUNTER — Ambulatory Visit: Payer: 59 | Admitting: Family Medicine

## 2022-12-24 ENCOUNTER — Encounter: Payer: Self-pay | Admitting: Family Medicine

## 2022-12-24 ENCOUNTER — Ambulatory Visit (INDEPENDENT_AMBULATORY_CARE_PROVIDER_SITE_OTHER): Payer: 59 | Admitting: Family Medicine

## 2022-12-24 VITALS — BP 120/84 | HR 92 | Temp 97.6°F | Ht 72.0 in | Wt 233.2 lb

## 2022-12-24 DIAGNOSIS — E669 Obesity, unspecified: Secondary | ICD-10-CM

## 2022-12-24 DIAGNOSIS — E782 Mixed hyperlipidemia: Secondary | ICD-10-CM | POA: Diagnosis not present

## 2022-12-24 DIAGNOSIS — Z23 Encounter for immunization: Secondary | ICD-10-CM | POA: Diagnosis not present

## 2022-12-24 DIAGNOSIS — R7303 Prediabetes: Secondary | ICD-10-CM

## 2022-12-24 DIAGNOSIS — Z7185 Encounter for immunization safety counseling: Secondary | ICD-10-CM

## 2022-12-24 DIAGNOSIS — R7989 Other specified abnormal findings of blood chemistry: Secondary | ICD-10-CM | POA: Diagnosis not present

## 2022-12-24 LAB — COMPREHENSIVE METABOLIC PANEL
ALT: 13 U/L (ref 0–53)
AST: 12 U/L (ref 0–37)
Albumin: 4.4 g/dL (ref 3.5–5.2)
Alkaline Phosphatase: 53 U/L (ref 39–117)
BUN: 18 mg/dL (ref 6–23)
CO2: 26 mEq/L (ref 19–32)
Calcium: 9.6 mg/dL (ref 8.4–10.5)
Chloride: 105 mEq/L (ref 96–112)
Creatinine, Ser: 0.9 mg/dL (ref 0.40–1.50)
GFR: 91.73 mL/min (ref >60.00)
Glucose, Bld: 105 mg/dL — ABNORMAL HIGH (ref 70–99)
Potassium: 4.5 mEq/L (ref 3.5–5.1)
Sodium: 139 mEq/L (ref 135–145)
Total Bilirubin: 0.5 mg/dL (ref 0.2–1.2)
Total Protein: 6.8 g/dL (ref 6.0–8.3)

## 2022-12-24 LAB — LIPID PANEL
Cholesterol: 179 mg/dL (ref 0–200)
HDL: 48.5 mg/dL (ref >39.00)
LDL Cholesterol: 100 mg/dL — ABNORMAL HIGH (ref 0–99)
NonHDL: 130.62
Total CHOL/HDL Ratio: 4
Triglycerides: 154 mg/dL — ABNORMAL HIGH (ref 0.0–149.0)
VLDL: 30.8 mg/dL (ref 0.0–40.0)

## 2022-12-24 LAB — HEMOGLOBIN A1C: Hgb A1c MFr Bld: 6.4 % (ref 4.6–6.5)

## 2022-12-24 MED ORDER — PHENTERMINE HCL 37.5 MG PO TABS: 18.2500 mg | ORAL_TABLET | Freq: Every day | ORAL | 5 refills | Status: DC

## 2022-12-24 NOTE — Progress Notes (Signed)
Subjective  CC:  Chief Complaint  Patient presents with   Weight Check    Last wt was 244   Hyperlipidemia   Hyperglycemia    HPI: Joseph Gaines is a 62 y.o. male who presents to the office today to address the problems listed above in the chief complaint. Obesity: On phentermine and tolerating well.  No adverse effects.  Taking half a pill daily.  Went about 3 weeks without it due to COVID infection.  Has helped to suppress appetite.  Eating better.  Has given up alcohol.  Has lost about 10 pounds.  Exercising regularly at the gym.  Feels well.  No adverse effects.  May be elevating diastolic blood pressure slightly. Hyperlipidemia: Changed to Crestor 10 mg nightly for better control.  Tolerating well.  Recheck today Prediabetes, last A1c 3 months ago was 6.3.  He has changed diet.  Healthy eating.  Due for recheck.  No symptoms of hyperglycemia. Discussed hypotestosteronism.  He has high normal PSA and would defer treatment.  Fortunately weight loss is doing well and energy levels are improved. Eligible for Tdap and Shingrix.  Will defer Shingrix per nurse visit.  Assessment  1. Mixed hyperlipidemia   2. Obesity (BMI 30-39.9)   3. Prediabetes   4. Low testosterone   5. Vaccine counseling   6. Need for Tdap vaccination      Plan  Hyperlipidemia: Tolerating Crestor 10, recheck today check LFTs Obesity: Making excellent lifestyle choices continue healthy diet exercise phentermine and recheck in 3 to 6 months Prediabetes: Hopefully should be improved with better diet.  Recheck today Monitor blood pressure on phentermine Updated Tdap today.  Patient to schedule nurse visit for Shingrix vaccinations  Follow up: 3 to 6 months depending on lab work results Visit date not found  Orders Placed This Encounter  Procedures   Tdap vaccine greater than or equal to 7yo IM   Comprehensive metabolic panel   Lipid panel   Hemoglobin A1c   Meds ordered this encounter  Medications    phentermine (ADIPEX-P) 37.5 MG tablet    Sig: Take 0.5-1 tablets (18.75-37.5 mg total) by mouth daily before breakfast.    Dispense:  30 tablet    Refill:  5      I reviewed the patients updated PMH, FH, and SocHx.    Patient Active Problem List   Diagnosis Date Noted   Primary osteoarthritis of both knees 08/13/2022   Left knee pain 04/24/2022   Epidermal cyst 09/19/2020   Obesity (BMI 30-39.9) 04/12/2020   Seasonal allergic rhinitis due to pollen 12/25/2017   Exercise-induced asthma 01/15/2015   Family history of premature CAD 01/15/2015   Mixed hyperlipidemia 07/28/2007   GERD (gastroesophageal reflux disease) 06/30/2007   Current Meds  Medication Sig   Albuterol Sulfate 108 (90 Base) MCG/ACT AEPB Inhale 2 puffs into the lungs every 4 (four) hours as needed. (Patient taking differently: Inhale 2 puffs into the lungs as needed.)   fluticasone (FLONASE) 50 MCG/ACT nasal spray Place into the nose.   Multiple Vitamin (MULTIVITAMIN WITH MINERALS) TABS tablet Take 1 tablet by mouth daily.   rosuvastatin (CRESTOR) 10 MG tablet Take 1 tablet (10 mg total) by mouth at bedtime.   [DISCONTINUED] phentermine (ADIPEX-P) 37.5 MG tablet Take 0.5-1 tablets (18.75-37.5 mg total) by mouth daily before breakfast.    Allergies: Patient has No Known Allergies. Family History: Patient family history includes Aortic aneurysm in his mother; Asthma in his father; COPD in his mother;  Cancer in his father; Early death (age of onset: 60) in his father; Hearing loss in his father; Heart disease (age of onset: 34) in his father; Heart disease (age of onset: 84) in his mother; Hyperlipidemia in his brother; Hypertension in his father. Social History:  Patient  reports that he quit smoking about 34 years ago. His smoking use included cigarettes. He has quit using smokeless tobacco. He reports current alcohol use of about 2.0 standard drinks of alcohol per week. He reports that he does not use drugs.  Review  of Systems: Constitutional: Negative for fever malaise or anorexia Cardiovascular: negative for chest pain Respiratory: negative for SOB or persistent cough Gastrointestinal: negative for abdominal pain  Objective  Vitals: BP 120/84   Pulse 92   Temp 97.6 F (36.4 C)   Ht 6' (1.829 m)   Wt 233 lb 3.2 oz (105.8 kg)   SpO2 96%   BMI 31.63 kg/m  General: no acute distress , A&Ox3  Commons side effects, risks, benefits, and alternatives for medications and treatment plan prescribed today were discussed, and the patient expressed understanding of the given instructions. Patient is instructed to call or message via MyChart if he/she has any questions or concerns regarding our treatment plan. No barriers to understanding were identified. We discussed Red Flag symptoms and signs in detail. Patient expressed understanding regarding what to do in case of urgent or emergency type symptoms.  Medication list was reconciled, printed and provided to the patient in AVS. Patient instructions and summary information was reviewed with the patient as documented in the AVS. This note was prepared with assistance of Dragon voice recognition software. Occasional wrong-word or sound-a-like substitutions may have occurred due to the inherent limitations of voice recognition software

## 2022-12-24 NOTE — Patient Instructions (Signed)
Please return in 3-6 months for recheck. I'll be more specific after your lab work returns.   Today you were given your Tdap vaccination. It is good for 10 years.  Schedule a visit for your 1st of 2 Shingrix vaccines at your convenience.   If you have any questions or concerns, please don't hesitate to send me a message via MyChart or call the office at [phone]. Thank you for visiting with [place] today! It's our pleasure caring for you.

## 2023-04-01 ENCOUNTER — Ambulatory Visit (INDEPENDENT_AMBULATORY_CARE_PROVIDER_SITE_OTHER): Payer: 59 | Admitting: Family Medicine

## 2023-04-01 ENCOUNTER — Ambulatory Visit (INDEPENDENT_AMBULATORY_CARE_PROVIDER_SITE_OTHER): Payer: 59

## 2023-04-01 ENCOUNTER — Encounter (HOSPITAL_BASED_OUTPATIENT_CLINIC_OR_DEPARTMENT_OTHER): Payer: Self-pay | Admitting: Family Medicine

## 2023-04-01 VITALS — BP 140/86 | HR 88 | Ht 72.0 in | Wt 230.0 lb

## 2023-04-01 DIAGNOSIS — M25561 Pain in right knee: Secondary | ICD-10-CM

## 2023-04-01 NOTE — Progress Notes (Signed)
    Procedures performed today:    None.  Independent interpretation of notes and tests performed by another provider:   None.  Brief History, Exam, Impression, and Recommendations:    BP (!) 140/86 (BP Location: Left Arm, Patient Position: Sitting, Cuff Size: Normal)   Pulse 88   Ht 6' (1.829 m)   Wt 230 lb (104.3 kg)   SpO2 96%   BMI 31.19 kg/m   Right knee pain, unspecified chronicity Assessment & Plan: Patient presents for evaluation of right knee pain.  He previously has been seen for left knee pain.  Reports that right knee pain has been more noticeable over the past few weeks.  No recent trauma/inciting event.  Pain will be worse with certain activities, particularly with use of stairs, also notes increased stiffness and pain with prolonged immobility.  He does do a fair amount of driving related to work and will have more stiffness/pain when getting out of the car after a longer drive.  Pain is mostly along anterior joint line, more so along medial aspect.  No instability reported. On exam, patient is in no acute distress Right knee: No obvious deformity. No effusion.  Negative patellar grind.  Negative crepitus. Full ROM for flexion and extension.  Strength 5 out of 5 for flexion and extension. Lachman: Negative McMurray's: Positive, mild Neurovascularly intact.  No evidence of lymphatic disease.  Exam most consistent with likely underlying arthritis.  Does have some signs and symptoms which would suggest possible patellofemoral pain syndrome/arthralgia.  Unfortunately, he has had prior left knee x-ray imaging, however no right knee imaging completed previously.  He does have some interest in possible steroid injection if underlying arthritis confirmed and no other concerning joint abnormality.  To this end, we will proceed with initial x-ray imaging.  Additional consideration is for OTC medications, topical treatments, physical therapy, home exercise program as per PT.  Also  offered Toradol shot in office today, patient declines at this time.  Ultimately, once x-rays are completed, can possibly consider steroid injection to help with control of symptoms.  Patient will proceed with x-ray imaging at this time.  Orders: -     DG Knee Complete 4 Views Right; Future -     Ambulatory referral to Physical Therapy    ___________________________________________ Zaley Talley de [place], MD, ABFM, CAQSM Primary Care and Sports Medicine [facility]

## 2023-04-06 ENCOUNTER — Telehealth (HOSPITAL_BASED_OUTPATIENT_CLINIC_OR_DEPARTMENT_OTHER): Payer: Self-pay | Admitting: Family Medicine

## 2023-04-06 NOTE — Telephone Encounter (Signed)
Patient called in to discuss xray please call

## 2023-04-07 NOTE — Assessment & Plan Note (Signed)
Patient presents for evaluation of right knee pain.  He previously has been seen for left knee pain.  Reports that right knee pain has been more noticeable over the past few weeks.  No recent trauma/inciting event.  Pain will be worse with certain activities, particularly with use of stairs, also notes increased stiffness and pain with prolonged immobility.  He does do a fair amount of driving related to work and will have more stiffness/pain when getting out of the car after a longer drive.  Pain is mostly along anterior joint line, more so along medial aspect.  No instability reported. On exam, patient is in no acute distress Right knee: No obvious deformity. No effusion.  Negative patellar grind.  Negative crepitus. Full ROM for flexion and extension.  Strength 5 out of 5 for flexion and extension. Lachman: Negative McMurray's: Positive, mild Neurovascularly intact.  No evidence of lymphatic disease.  Exam most consistent with likely underlying arthritis.  Does have some signs and symptoms which would suggest possible patellofemoral pain syndrome/arthralgia.  Unfortunately, he has had prior left knee x-ray imaging, however no right knee imaging completed previously.  He does have some interest in possible steroid injection if underlying arthritis confirmed and no other concerning joint abnormality.  To this end, we will proceed with initial x-ray imaging.  Additional consideration is for OTC medications, topical treatments, physical therapy, home exercise program as per PT.  Also offered Toradol shot in office today, patient declines at this time.  Ultimately, once x-rays are completed, can possibly consider steroid injection to help with control of symptoms.  Patient will proceed with x-ray imaging at this time.

## 2023-04-07 NOTE — Telephone Encounter (Signed)
I advised the patient, of there results, pt would like to know what the treatment plan was. I advised that Dr Tommi Rumps [place] was waiting for the final reading and would be back in touch with a treatment plan

## 2023-08-18 ENCOUNTER — Other Ambulatory Visit: Payer: Self-pay | Admitting: Family Medicine

## 2023-08-19 ENCOUNTER — Other Ambulatory Visit: Payer: Self-pay | Admitting: Family Medicine

## 2023-08-20 NOTE — Telephone Encounter (Signed)
Please call to have him schedule an office visit to recheck weight and sugars. Due now. Thanks.

## 2023-11-30 ENCOUNTER — Encounter: Payer: Self-pay | Admitting: Family Medicine

## 2023-11-30 ENCOUNTER — Ambulatory Visit (INDEPENDENT_AMBULATORY_CARE_PROVIDER_SITE_OTHER): Payer: 59 | Admitting: Family Medicine

## 2023-11-30 VITALS — BP 123/83 | HR 81 | Temp 98.2°F | Ht 72.0 in | Wt 238.6 lb

## 2023-11-30 DIAGNOSIS — Z8249 Family history of ischemic heart disease and other diseases of the circulatory system: Secondary | ICD-10-CM | POA: Diagnosis not present

## 2023-11-30 DIAGNOSIS — R7303 Prediabetes: Secondary | ICD-10-CM | POA: Insufficient documentation

## 2023-11-30 DIAGNOSIS — Z0000 Encounter for general adult medical examination without abnormal findings: Secondary | ICD-10-CM

## 2023-11-30 DIAGNOSIS — R030 Elevated blood-pressure reading, without diagnosis of hypertension: Secondary | ICD-10-CM | POA: Insufficient documentation

## 2023-11-30 DIAGNOSIS — Z23 Encounter for immunization: Secondary | ICD-10-CM | POA: Diagnosis not present

## 2023-11-30 DIAGNOSIS — J45990 Exercise induced bronchospasm: Secondary | ICD-10-CM

## 2023-11-30 DIAGNOSIS — E782 Mixed hyperlipidemia: Secondary | ICD-10-CM | POA: Diagnosis not present

## 2023-11-30 DIAGNOSIS — Z0001 Encounter for general adult medical examination with abnormal findings: Secondary | ICD-10-CM

## 2023-11-30 DIAGNOSIS — Z8481 Family history of carrier of genetic disease: Secondary | ICD-10-CM

## 2023-11-30 DIAGNOSIS — E669 Obesity, unspecified: Secondary | ICD-10-CM

## 2023-11-30 DIAGNOSIS — Z6832 Body mass index (BMI) 32.0-32.9, adult: Secondary | ICD-10-CM

## 2023-11-30 LAB — COMPREHENSIVE METABOLIC PANEL
ALT: 19 U/L (ref 0–53)
AST: 16 U/L (ref 0–37)
Albumin: 4.7 g/dL (ref 3.5–5.2)
Alkaline Phosphatase: 46 U/L (ref 39–117)
BUN: 18 mg/dL (ref 6–23)
CO2: 24 meq/L (ref 19–32)
Calcium: 9.4 mg/dL (ref 8.4–10.5)
Chloride: 102 meq/L (ref 96–112)
Creatinine, Ser: 0.93 mg/dL (ref 0.40–1.50)
GFR: 87.61 mL/min (ref >60.00)
Glucose, Bld: 118 mg/dL — ABNORMAL HIGH (ref 70–99)
Potassium: 4.2 meq/L (ref 3.5–5.1)
Sodium: 138 meq/L (ref 135–145)
Total Bilirubin: 0.7 mg/dL (ref 0.2–1.2)
Total Protein: 7.3 g/dL (ref 6.0–8.3)

## 2023-11-30 LAB — LIPID PANEL
Cholesterol: 208 mg/dL — ABNORMAL HIGH (ref 0–200)
HDL: 54.3 mg/dL (ref >39.00)
LDL Cholesterol: 109 mg/dL — ABNORMAL HIGH (ref 0–99)
NonHDL: 153.84
Total CHOL/HDL Ratio: 4
Triglycerides: 222 mg/dL — ABNORMAL HIGH (ref 0.0–149.0)
VLDL: 44.4 mg/dL — ABNORMAL HIGH (ref 0.0–40.0)

## 2023-11-30 LAB — CBC WITH DIFFERENTIAL/PLATELET
Basophils Absolute: 0 10*3/uL (ref 0.0–0.1)
Basophils Relative: 0.4 % (ref 0.0–3.0)
Eosinophils Absolute: 0.1 10*3/uL (ref 0.0–0.7)
Eosinophils Relative: 1.5 % (ref 0.0–5.0)
HCT: 43.8 % (ref 39.0–52.0)
Hemoglobin: 14.8 g/dL (ref 13.0–17.0)
Lymphocytes Relative: 23.6 % (ref 12.0–46.0)
Lymphs Abs: 1.3 10*3/uL (ref 0.7–4.0)
MCHC: 33.7 g/dL (ref 30.0–36.0)
MCV: 83.3 fl (ref 78.0–100.0)
Monocytes Absolute: 0.3 10*3/uL (ref 0.1–1.0)
Monocytes Relative: 5.1 % (ref 3.0–12.0)
Neutro Abs: 3.8 10*3/uL (ref 1.4–7.7)
Neutrophils Relative %: 69.4 % (ref 43.0–77.0)
Platelets: 288 10*3/uL (ref 150.0–400.0)
RBC: 5.26 Mil/uL (ref 4.22–5.81)
RDW: 13.2 % (ref 11.5–15.5)
WBC: 5.4 10*3/uL (ref 4.0–10.5)

## 2023-11-30 LAB — HEMOGLOBIN A1C: Hgb A1c MFr Bld: 6.3 % (ref 4.6–6.5)

## 2023-11-30 LAB — TSH: TSH: 0.9 u[IU]/mL (ref 0.35–5.50)

## 2023-11-30 NOTE — Patient Instructions (Signed)
 Please return in 6 months to recheck blood pressure and sugar test.   I will release your lab results to you on your MyChart account with further instructions. You may see the results before I do, but when I review them I will send you a message with my report or have my assistant call you if things need to be discussed. Please reply to my message with any questions. Thank you!   If you have any questions or concerns, please don't hesitate to send me a message via MyChart or call the office at [phone]. Thank you for visiting with [place] today! It's our pleasure caring for you.   VISIT SUMMARY:  Today, we discussed your interest in genetic testing due to your family history of the BRCA gene, concerns about potential hearing loss, and your ongoing efforts in weight management. We also reviewed your general health maintenance needs.  YOUR PLAN:  -GENETIC RISK FOR BRCA MUTATION: Given your family history of the BRCA gene and its implications, including Fanconi anemia in your grandchild, we will refer you to a genetic counselor. They will provide comprehensive counseling and discuss potential testing to determine if you carry the gene.  -HEARING LOSS: You have reported that your wife has noticed you tend to have the television volume up high, which may indicate hearing loss. We will perform a hearing screen today, and if the results are abnormal, we will recommend a full audiology evaluation.  -WEIGHT MANAGEMENT: You have been actively managing your weight through regular exercise and dietary changes. Since you did not tolerate phentermine well, we will check your A1C levels today. If your A1C is above 6.5, we may consider medications that can help manage diabetes and aid in weight loss. If it is below 6.5, you may consider the out-of-pocket cost for these medications for weight loss.  -GENERAL HEALTH MAINTENANCE: We will administer the shingles vaccine in 2 weeks to help protect you against shingles.  You had your flu shot today  INSTRUCTIONS:  Please follow up with the genetic counselor for your BRCA testing. We will perform a hearing screen today, and based on the results, we may refer you for further evaluation. Additionally, we will check your A1C levels today to determine the next steps for your weight management plan. Remember to return in 2 weeks for your shingles vaccine.
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
## 2023-12-03 ENCOUNTER — Encounter: Payer: Self-pay | Admitting: Family Medicine

## 2023-12-03 MED ORDER — ROSUVASTATIN CALCIUM 20 MG PO TABS: 20.0000 mg | ORAL_TABLET | Freq: Every evening | ORAL | 3 refills | Status: AC

## 2023-12-03 NOTE — Addendum Note (Signed)
 Addended by: Asencion Partridge on: 12/03/2023 02:33 PM   Modules accepted: Orders

## 2023-12-03 NOTE — Progress Notes (Signed)
 Labs reviewed.  The 10-year ASCVD risk score (Arnett DK, et al., 2019) is: 10% on crestor 10   Values used to calculate the score:     Age: 63 years     Sex: Male     Is Non-Hispanic African American: No     Diabetic: No     Tobacco smoker: No     Systolic Blood Pressure: 123 mmHg     Is BP treated: No     HDL Cholesterol: 54.3 mg/dL     Total Cholesterol: 208 mg/dL  Dan, Your sugars remain mildly elevated in the prediabetic range. Therefore, insurance will not cover the GLP-1 medications for diabetes. You may contact your insurance to inquire if Wegovy or Zepbound are covered for weight loss/obesity. Or I could order it for you and you could see what the out of pocket cost would be.   I'd like to increase the dose of your crestor to push your cholesterol LDL below 100. I have ordered crestor 20mg  to be taken nightly instead of the 10mg . Your other labs look fine.  Sincerely, Dr. Mardelle Matte

## 2023-12-17 ENCOUNTER — Ambulatory Visit (INDEPENDENT_AMBULATORY_CARE_PROVIDER_SITE_OTHER): Payer: 59

## 2023-12-17 DIAGNOSIS — Z23 Encounter for immunization: Secondary | ICD-10-CM

## 2023-12-17 NOTE — Progress Notes (Signed)
 Patient is in office today for a nurse visit for  Shingrix #1 , per PCP's order. Patient Injection was given in the  Left deltoid. Patient tolerated injection well.

## 2024-01-14 ENCOUNTER — Other Ambulatory Visit

## 2024-01-14 ENCOUNTER — Encounter: Admitting: Genetic Counselor

## 2024-01-28 ENCOUNTER — Encounter: Payer: 59 | Admitting: Genetic Counselor

## 2024-01-28 ENCOUNTER — Other Ambulatory Visit: Payer: 59

## 2024-02-25 ENCOUNTER — Encounter (HOSPITAL_BASED_OUTPATIENT_CLINIC_OR_DEPARTMENT_OTHER): Payer: Self-pay | Admitting: Pulmonary Disease

## 2024-02-25 ENCOUNTER — Ambulatory Visit (HOSPITAL_BASED_OUTPATIENT_CLINIC_OR_DEPARTMENT_OTHER): Admitting: Pulmonary Disease

## 2024-02-25 VITALS — BP 133/81 | HR 97 | Ht 72.0 in | Wt 240.3 lb

## 2024-02-25 DIAGNOSIS — Z87891 Personal history of nicotine dependence: Secondary | ICD-10-CM | POA: Diagnosis not present

## 2024-02-25 DIAGNOSIS — R0683 Snoring: Secondary | ICD-10-CM

## 2024-02-25 NOTE — Progress Notes (Signed)
 Epworth Sleepiness Scale  Use the following scale to choose the most appropriate number for each situation. 0 Would never nod off 1  Slight  chance of nodding off 2 Moderate chance of nodding off 3 High chance of nodding off  Sitting and reading: 1 Watching TV: 0 Sitting, inactive, in a public place (e.g., in a meeting, theater, or dinner event): 0 As a passenger in a car for an hour or more without stopping for a break: 0 Lying down to rest when circumstances permit:3 Sitting and talking to someone: 0 Sitting quietly after a meal without alcohol: 0 In a car, while stopped for a few  minutes in traffic or at a light: 0  TOTOAL: 4
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
## 2024-02-25 NOTE — Patient Instructions (Addendum)
 Home sleep test  YOUR PLAN:  -SNORING: Snoring is the sound produced by the vibration of the soft tissues in your throat during sleep. It can affect your sleep quality and that of your spouse. We will order a home sleep test to check for sleep apnea, which can cause serious health issues like cardiovascular stress and hypertension. If sleep apnea is diagnosed, we will start a CPAP trial for 4 weeks. In the meantime, use Flonase nasal spray, one spray in each nostril at bedtime for 1-2 weeks, to see if it helps with your snoring. If no sleep apnea is found, we will discuss other techniques to reduce snoring such as nasal dilators (bongo) or [employer]

## 2024-03-01 ENCOUNTER — Ambulatory Visit: Admitting: Sleep Medicine

## 2024-03-03 ENCOUNTER — Ambulatory Visit (HOSPITAL_BASED_OUTPATIENT_CLINIC_OR_DEPARTMENT_OTHER): Admitting: Nurse Practitioner

## 2024-03-10 ENCOUNTER — Encounter

## 2024-03-10 DIAGNOSIS — R0683 Snoring: Secondary | ICD-10-CM

## 2024-03-20 ENCOUNTER — Telehealth: Payer: Self-pay | Admitting: Pulmonary Disease

## 2024-03-20 DIAGNOSIS — G4733 Obstructive sleep apnea (adult) (pediatric): Secondary | ICD-10-CM | POA: Diagnosis not present

## 2024-03-20 DIAGNOSIS — R0683 Snoring: Secondary | ICD-10-CM

## 2024-03-20 NOTE — Telephone Encounter (Signed)
 HST showed severe OSA with AHI 50/ hr & low sat 75% Suggest autoCPAP  5-15 cm, mask of choice OV with me/APP in 6 wks after starting

## 2024-03-21 NOTE — Telephone Encounter (Signed)
 Order placed

## 2024-03-21 NOTE — Telephone Encounter (Signed)
 Pt.notified

## 2024-03-21 NOTE — Telephone Encounter (Signed)
Left pt message to return call 

## 2024-04-12 ENCOUNTER — Telehealth (HOSPITAL_BASED_OUTPATIENT_CLINIC_OR_DEPARTMENT_OTHER): Payer: Self-pay

## 2024-04-12 NOTE — Telephone Encounter (Signed)
 CMN received signed by provider and faxed , confirmation recevied

## 2024-05-31 ENCOUNTER — Ambulatory Visit (INDEPENDENT_AMBULATORY_CARE_PROVIDER_SITE_OTHER): Payer: 59 | Admitting: Family Medicine

## 2024-05-31 VITALS — BP 109/73 | HR 69 | Temp 98.1°F | Ht 72.0 in | Wt 233.6 lb

## 2024-05-31 DIAGNOSIS — Z1509 Genetic susceptibility to other malignant neoplasm: Secondary | ICD-10-CM

## 2024-05-31 DIAGNOSIS — E782 Mixed hyperlipidemia: Secondary | ICD-10-CM | POA: Diagnosis not present

## 2024-05-31 DIAGNOSIS — G4733 Obstructive sleep apnea (adult) (pediatric): Secondary | ICD-10-CM | POA: Insufficient documentation

## 2024-05-31 DIAGNOSIS — Z1503 Genetic susceptibility to malignant neoplasm of prostate: Secondary | ICD-10-CM

## 2024-05-31 DIAGNOSIS — R7303 Prediabetes: Secondary | ICD-10-CM | POA: Diagnosis not present

## 2024-05-31 DIAGNOSIS — Z23 Encounter for immunization: Secondary | ICD-10-CM | POA: Diagnosis not present

## 2024-05-31 DIAGNOSIS — L639 Alopecia areata, unspecified: Secondary | ICD-10-CM | POA: Insufficient documentation

## 2024-05-31 DIAGNOSIS — R030 Elevated blood-pressure reading, without diagnosis of hypertension: Secondary | ICD-10-CM

## 2024-05-31 DIAGNOSIS — Z1501 Genetic susceptibility to malignant neoplasm of breast: Secondary | ICD-10-CM | POA: Diagnosis not present

## 2024-05-31 DIAGNOSIS — E669 Obesity, unspecified: Secondary | ICD-10-CM

## 2024-05-31 LAB — LIPID PANEL
Cholesterol: 147 mg/dL (ref 0–200)
HDL: 46.8 mg/dL (ref >39.00)
LDL Cholesterol: 82 mg/dL (ref 0–99)
NonHDL: 100.38
Total CHOL/HDL Ratio: 3
Triglycerides: 92 mg/dL (ref 0.0–149.0)
VLDL: 18.4 mg/dL (ref 0.0–40.0)

## 2024-05-31 LAB — COMPREHENSIVE METABOLIC PANEL WITH GFR
ALT: 16 U/L (ref 0–53)
AST: 16 U/L (ref 0–37)
Albumin: 4.7 g/dL (ref 3.5–5.2)
Alkaline Phosphatase: 44 U/L (ref 39–117)
BUN: 17 mg/dL (ref 6–23)
CO2: 25 meq/L (ref 19–32)
Calcium: 9.6 mg/dL (ref 8.4–10.5)
Chloride: 104 meq/L (ref 96–112)
Creatinine, Ser: 1.02 mg/dL (ref 0.40–1.50)
GFR: 78.15 mL/min (ref >60.00)
Glucose, Bld: 113 mg/dL — ABNORMAL HIGH (ref 70–99)
Potassium: 4.5 meq/L (ref 3.5–5.1)
Sodium: 139 meq/L (ref 135–145)
Total Bilirubin: 0.5 mg/dL (ref 0.2–1.2)
Total Protein: 7.3 g/dL (ref 6.0–8.3)

## 2024-05-31 LAB — POCT GLYCOSYLATED HEMOGLOBIN (HGB A1C): Hemoglobin A1C: 5.6 % (ref 4.0–5.6)

## 2024-05-31 NOTE — Progress Notes (Signed)
 Subjective  CC:  Chief Complaint  Patient presents with   Prediabetes   prehypertension   Hyperlipidemia    HPI: Joseph Gaines is a 63 y.o. male who presents to the office today to address the problems listed above in the chief complaint. Discussed the use of AI scribe software for clinical note transcription with the patient, who gave verbal consent to proceed.  History of Present Illness Joseph Gaines is a 63 year old male with prediabetes and sleep apnea who presents for follow-up on his CPAP use and weight management, prediabetes and blood pressure check up.Joseph Gaines  His A1c has improved to 5.3 from a previous 6.3. He has been following the [employer] since the beginning of the month, focusing on vegetables, lean meats, and cutting out sugar and alcohol. He has lost approximately 8 to 10 pounds in the last month.  He recently started using a CPAP machine for sleep apnea, which has significantly improved his sleep quality and daytime alertness. He was previously diagnosed with snoring about 15 years ago, but his condition has progressed, likely due to weight gain. He has been using the CPAP for about two months and reports some issues with mask fit but overall finds it beneficial. No significant issues with the CPAP mask aside from occasional fit problems.  He continues to engage in regular physical activity, including going to the gym three times a week, golfing, biking, and swimming laps. He mentions a new interest in golf and cycling, and he has been actively participating in these activities.  He has a history of alopecia and has received four steroid injections for treatment. He notes new hair growth, although the new hair is gray. He is scheduled for a follow-up in September to assess the need for further treatment.  He is BRCA1 positive, as are his daughter Joseph Gaines and himself, while his wife Joseph Gaines and daughter Joseph Gaines are not. His son Joseph Gaines and Joseph Gaines have not been tested yet. His  granddaughter Joseph Gaines, born to Joseph Gaines, is two months old and doing well.  His niece Joseph Gaines, who has Fanconi anemia, is experiencing developmental delays, particularly in walking and talking. She is currently in a [employer] and has shown some progress in walking.   Assessment  1. Prediabetes   2. Need for shingles vaccine   3. OSA on CPAP   4. BRCA gene mutation positive in male   5. Mixed hyperlipidemia   6. Prehypertension   7. Obesity (BMI 30-39.9)      Plan  Assessment and Plan Assessment & Plan Obesity with associated prediabetes and obstructive sleep apnea Weight decreased by 8-10 pounds due to diet and exercise. A1c improved to 5.3. CPAP use improved sleep apnea symptoms. Discussed Zepbound for weight loss and potential sleep apnea benefits. Insurance coverage for Zepbound or Wegovy needs confirmation. - Call insurance to check coverage for Zepbound or [facility] for weight loss and/or sleep apnea. - Send MyChart message with insurance findings. - If covered, send prescription to pharmacy. - If not covered, consider self-pay program for Zepbound. - Continue CPAP use. - Continue current diet and exercise regimen.  Hyperlipidemia Managed with Lipitor. Blood pressure and sugar levels improved. - Recheck cholesterol levels. - Continue Lipitor.  Hypertension Previously high, now well-controlled. Blood pressure perfect today. Improved with weight loss and healthy diet.   Alopecia areata Undergoing steroid injections with new hair growth. Treatment well-tolerated. - Continue steroid injections as per dermatologist's plan.  BRCA1 gene mutation carrier Discussed family testing.  He and one daughter positive, wife and another daughter negative. Sons not tested. - Encourage family members to get tested for BRCA1 mutation.  General Health Maintenance Received first Shingrix  vaccine. - Administer second Shingrix  vaccine today.  Follow-Up Plans to travel and attend events.  Plans to start weight loss medication after trips. - Wait to start weight loss medication until after returning from trips. - Send MyChart message with insurance findings regarding weight loss medication.    Follow up: 6 mo for cpe Orders Placed This Encounter  Procedures   Zoster Recombinant (Shingrix  )   Lipid panel   Comprehensive metabolic panel with GFR   POCT HgB A1C   No orders of the defined types were placed in this encounter.    I reviewed the patients updated PMH, FH, and SocHx.  Patient Active Problem List   Diagnosis Date Noted   OSA on CPAP 05/31/2024    Priority: High   BRCA gene mutation positive in male 05/31/2024    Priority: High   Prediabetes 11/30/2023    Priority: High   Prehypertension 11/30/2023    Priority: High   Obesity (BMI 30-39.9) 04/12/2020    Priority: High   Family history of premature CAD 01/15/2015    Priority: High   Mixed hyperlipidemia 07/28/2007    Priority: High   Primary osteoarthritis of both knees 08/13/2022    Priority: Medium    Exercise-induced asthma 01/15/2015    Priority: Medium    GERD (gastroesophageal reflux disease) 06/30/2007    Priority: Medium    Epidermal cyst 09/19/2020    Priority: Low   Seasonal allergic rhinitis due to pollen 12/25/2017    Priority: Low   Current Meds  Medication Sig   Albuterol  Sulfate 108 (90 Base) MCG/ACT AEPB Inhale 2 puffs into the lungs every 4 (four) hours as needed.   fluticasone (FLONASE) 50 MCG/ACT nasal spray Place into the nose.   Multiple Vitamin (MULTIVITAMIN WITH MINERALS) TABS tablet Take 1 tablet by mouth daily.   rosuvastatin  (CRESTOR ) 20 MG tablet Take 1 tablet (20 mg total) by mouth at bedtime.   Allergies: Patient has no known allergies. Family History: Patient family history includes Aortic aneurysm in his mother; Asthma in his father; COPD in his mother; Cancer in his father; Early death (age of onset: 60) in his father; Hearing loss in his father; Heart disease  (age of onset: 7) in his father; Heart disease (age of onset: 72) in his mother; Hyperlipidemia in his brother; Hypertension in his father. Social History:  Patient  reports that he quit smoking about 35 years ago. His smoking use included cigarettes. He has quit using smokeless tobacco. He reports current alcohol use of about 2.0 standard drinks of alcohol per week. He reports that he does not use drugs.  Review of Systems: Constitutional: Negative for fever malaise or anorexia Cardiovascular: negative for chest pain Respiratory: negative for SOB or persistent cough Gastrointestinal: negative for abdominal pain  Objective  Vitals: BP 109/73   Pulse 69   Temp 98.1 F (36.7 C)   Ht 6' (1.829 m)   Wt 233 lb 9.6 oz (106 kg)   SpO2 96%   BMI 31.68 kg/m  General: no acute distress , A&Ox3 HEENT: PEERL, conjunctiva normal, neck is supple Cardiovascular:  RRR without murmur or gallop.  Respiratory:  Good breath sounds bilaterally, CTAB with normal respiratory effort Skin:  Warm, no rashes Commons side effects, risks, benefits, and alternatives for medications and treatment plan prescribed today  were discussed, and the patient expressed understanding of the given instructions. Patient is instructed to call or message via MyChart if he/she has any questions or concerns regarding our treatment plan. No barriers to understanding were identified. We discussed Red Flag symptoms and signs in detail. Patient expressed understanding regarding what to do in case of urgent or emergency type symptoms.  Medication list was reconciled, printed and provided to the patient in AVS. Patient instructions and summary information was reviewed with the patient as documented in the AVS. This note was prepared with assistance of Dragon voice recognition software. Occasional wrong-word or sound-a-like substitutions may have occurred due to the inherent limitations of voice recognition software

## 2024-06-02 ENCOUNTER — Ambulatory Visit: Payer: Self-pay | Admitting: Family Medicine

## 2024-06-02 NOTE — Progress Notes (Signed)
 See mychart note Joseph Gaines, I agree: things look good!! Keep it up.  Sincerely, Dr. Jodie

## 2024-06-03 ENCOUNTER — Encounter: Payer: Self-pay | Admitting: Family Medicine

## 2024-06-07 MED ORDER — WEGOVY 0.25 MG/0.5ML ~~LOC~~ SOAJ
0.2500 mg | SUBCUTANEOUS | 0 refills | Status: AC
Start: 2024-06-07 — End: ?

## 2024-06-09 ENCOUNTER — Other Ambulatory Visit (HOSPITAL_COMMUNITY): Payer: Self-pay

## 2024-06-09 ENCOUNTER — Telehealth: Payer: Self-pay

## 2024-06-09 NOTE — Telephone Encounter (Signed)
 Pharmacy Patient Advocate Encounter  Received notification from [employer] that Prior Authorization for Wegovy  0.25MG /0.5ML auto-injectors has been DENIED.  Full denial letter will be uploaded to the media tab. See denial reason below.   PA #/Case ID/Reference #: [id number]

## 2024-06-09 NOTE — Telephone Encounter (Signed)
 Pharmacy Patient Advocate Encounter   Received notification from Onbase that prior authorization for Wegovy  0.25MG /0.5ML auto-injectors is required/requested.   Insurance verification completed.   The patient is insured through [facility] .   Per test claim: PA required; PA submitted to above mentioned insurance via Latent Key/confirmation #/EOC [id number] Status is pending

## 2024-06-22 LAB — COLOGUARD: COLOGUARD: NEGATIVE

## 2024-07-04 ENCOUNTER — Encounter: Payer: Self-pay | Admitting: Family Medicine

## 2024-07-21 ENCOUNTER — Other Ambulatory Visit: Payer: Self-pay

## 2024-07-25 ENCOUNTER — Telehealth: Payer: Self-pay

## 2024-07-25 NOTE — Telephone Encounter (Signed)
 Spoke with Toribio and he stated that he will need you to send the Zepbound vials (2.5mg )in to the pharmacy([facility])  NPI: [id number] NCPDP: [id number]

## 2024-07-26 NOTE — Telephone Encounter (Signed)
 Spoke to patient. He will use a Set designer.

## 2024-12-08 ENCOUNTER — Encounter: Admitting: Family Medicine

## 2025-01-20 ENCOUNTER — Encounter: Admitting: Family Medicine
